# Patient Record
Sex: Female | Born: 1960 | Race: White | Hispanic: No | Marital: Single | Smoking: Never smoker
Health system: Southern US, Community
[De-identification: ages and names within clinical notes are randomized; demographics above are authoritative.]

## PROBLEM LIST (undated history)

## (undated) DIAGNOSIS — F99 Mental disorder, not otherwise specified: Secondary | ICD-10-CM

## (undated) DIAGNOSIS — N189 Chronic kidney disease, unspecified: Secondary | ICD-10-CM

## (undated) DIAGNOSIS — Z9889 Other specified postprocedural states: Secondary | ICD-10-CM

## (undated) DIAGNOSIS — K219 Gastro-esophageal reflux disease without esophagitis: Secondary | ICD-10-CM

## (undated) DIAGNOSIS — F419 Anxiety disorder, unspecified: Secondary | ICD-10-CM

## (undated) DIAGNOSIS — E11319 Type 2 diabetes mellitus with unspecified diabetic retinopathy without macular edema: Secondary | ICD-10-CM

## (undated) DIAGNOSIS — H269 Unspecified cataract: Secondary | ICD-10-CM

## (undated) DIAGNOSIS — K069 Disorder of gingiva and edentulous alveolar ridge, unspecified: Secondary | ICD-10-CM

## (undated) DIAGNOSIS — E119 Type 2 diabetes mellitus without complications: Secondary | ICD-10-CM

## (undated) DIAGNOSIS — N951 Menopausal and female climacteric states: Secondary | ICD-10-CM

## (undated) DIAGNOSIS — R112 Nausea with vomiting, unspecified: Secondary | ICD-10-CM

## (undated) DIAGNOSIS — M1990 Unspecified osteoarthritis, unspecified site: Secondary | ICD-10-CM

## (undated) DIAGNOSIS — N949 Unspecified condition associated with female genital organs and menstrual cycle: Secondary | ICD-10-CM

## (undated) DIAGNOSIS — I10 Essential (primary) hypertension: Secondary | ICD-10-CM

## (undated) HISTORY — DX: Mental disorder, not otherwise specified: F99

## (undated) HISTORY — DX: Type 2 diabetes mellitus without complications: E11.9

## (undated) HISTORY — PX: HAND SURGERY: SHX662

## (undated) HISTORY — DX: Unspecified cataract: H26.9

## (undated) HISTORY — DX: Menopausal and female climacteric states: N95.1

## (undated) HISTORY — PX: OTHER SURGICAL HISTORY: SHX169

## (undated) HISTORY — DX: Chronic kidney disease, unspecified: N18.9

## (undated) HISTORY — DX: Unspecified condition associated with female genital organs and menstrual cycle: N94.9

## (undated) HISTORY — PX: EYE SURGERY: SHX253

## (undated) HISTORY — DX: Disorder of gingiva and edentulous alveolar ridge, unspecified: K06.9

---

## 2000-06-18 HISTORY — PX: SHOULDER SURGERY: SHX246

## 2003-01-26 ENCOUNTER — Ambulatory Visit (HOSPITAL_COMMUNITY): Admission: RE | Admit: 2003-01-26 | Discharge: 2003-01-26 | Payer: Self-pay | Admitting: Obstetrics and Gynecology

## 2003-01-26 ENCOUNTER — Encounter: Payer: Self-pay | Admitting: Obstetrics and Gynecology

## 2004-03-27 ENCOUNTER — Ambulatory Visit (HOSPITAL_COMMUNITY): Admission: RE | Admit: 2004-03-27 | Discharge: 2004-03-27 | Payer: Self-pay | Admitting: Obstetrics and Gynecology

## 2005-05-01 ENCOUNTER — Ambulatory Visit (HOSPITAL_COMMUNITY): Admission: RE | Admit: 2005-05-01 | Discharge: 2005-05-01 | Payer: Self-pay | Admitting: Obstetrics and Gynecology

## 2006-05-07 ENCOUNTER — Ambulatory Visit (HOSPITAL_COMMUNITY): Admission: RE | Admit: 2006-05-07 | Discharge: 2006-05-07 | Payer: Self-pay | Admitting: Obstetrics and Gynecology

## 2007-03-08 ENCOUNTER — Emergency Department (HOSPITAL_COMMUNITY): Admission: EM | Admit: 2007-03-08 | Discharge: 2007-03-08 | Payer: Self-pay | Admitting: Emergency Medicine

## 2007-09-09 ENCOUNTER — Ambulatory Visit (HOSPITAL_COMMUNITY): Admission: RE | Admit: 2007-09-09 | Discharge: 2007-09-09 | Payer: Self-pay | Admitting: Obstetrics and Gynecology

## 2007-12-11 IMAGING — CR DG FOOT COMPLETE 3+V*L*
3 series · 3 of 3 positions shown · non-contrast
Comparison: none

CLINICAL DATA: Pain over the dorsum of the left foot.  No trauma.  
 LEFT FOOT ? 3 VIEW:

[view not recorded (1 of 3)]
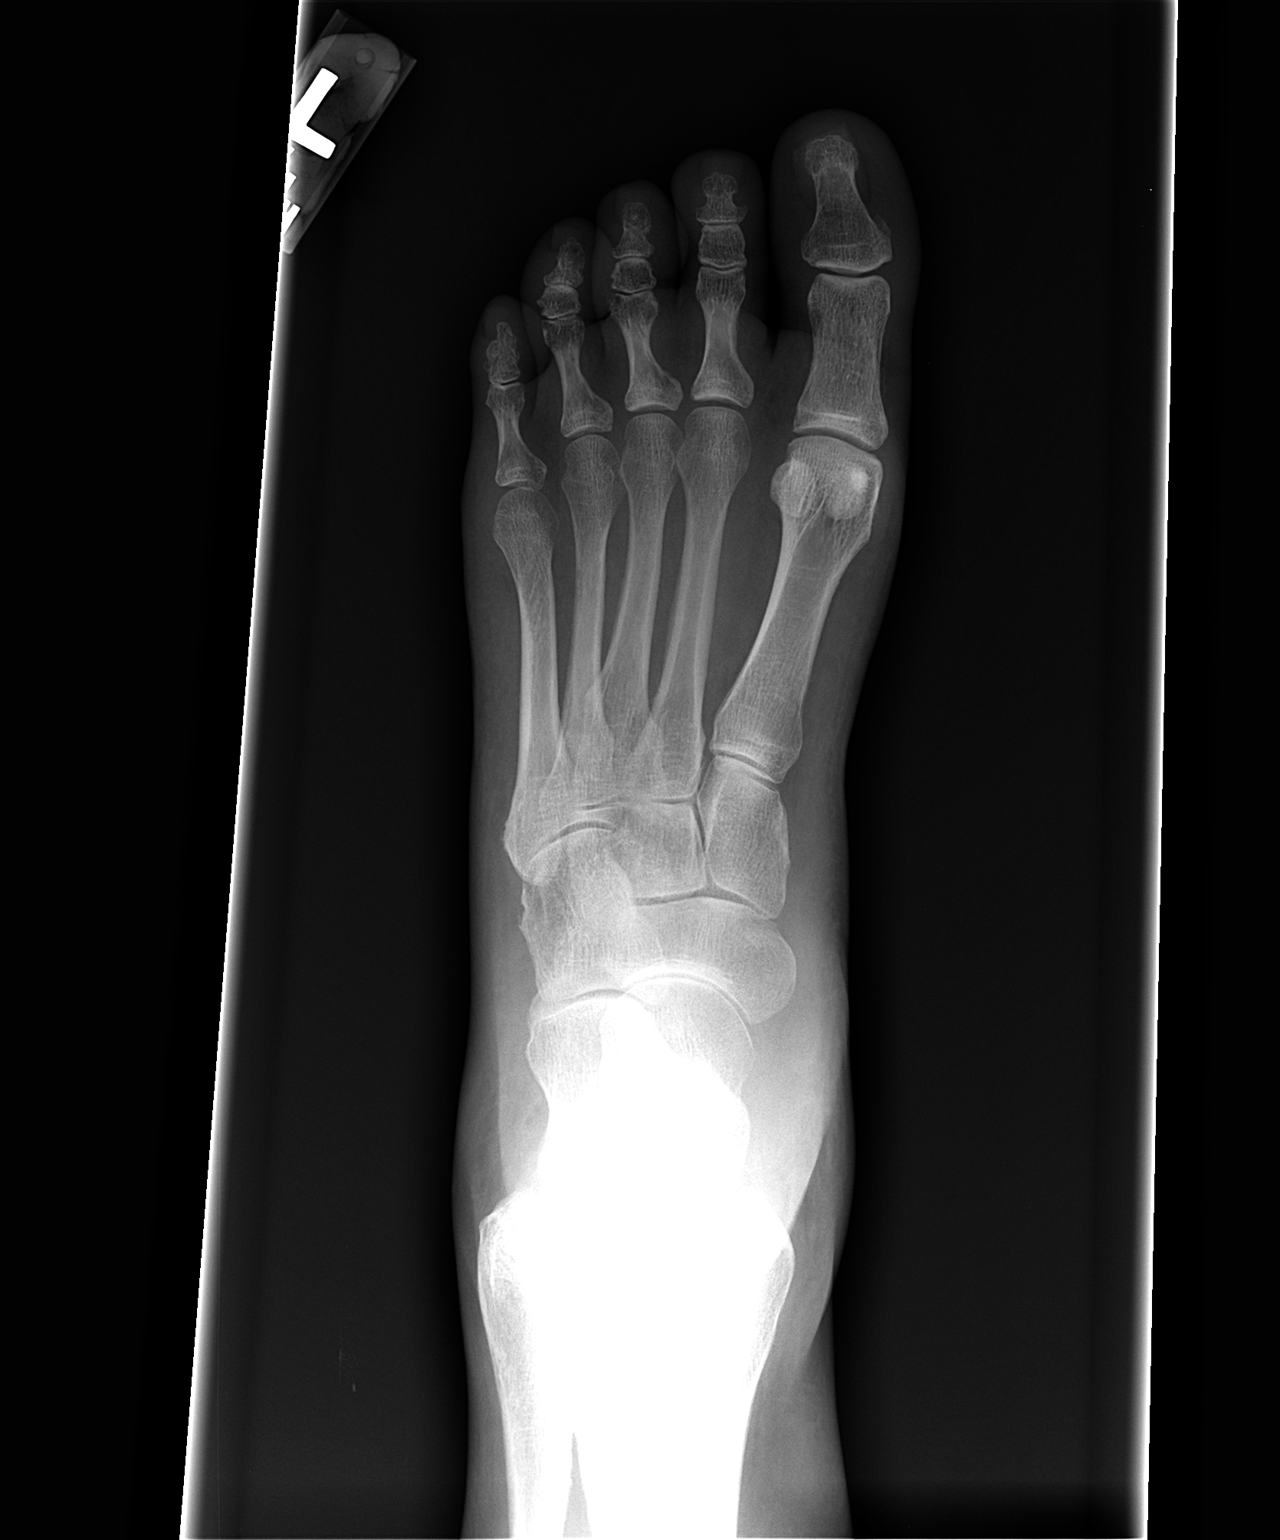

[view not recorded (2 of 3)]
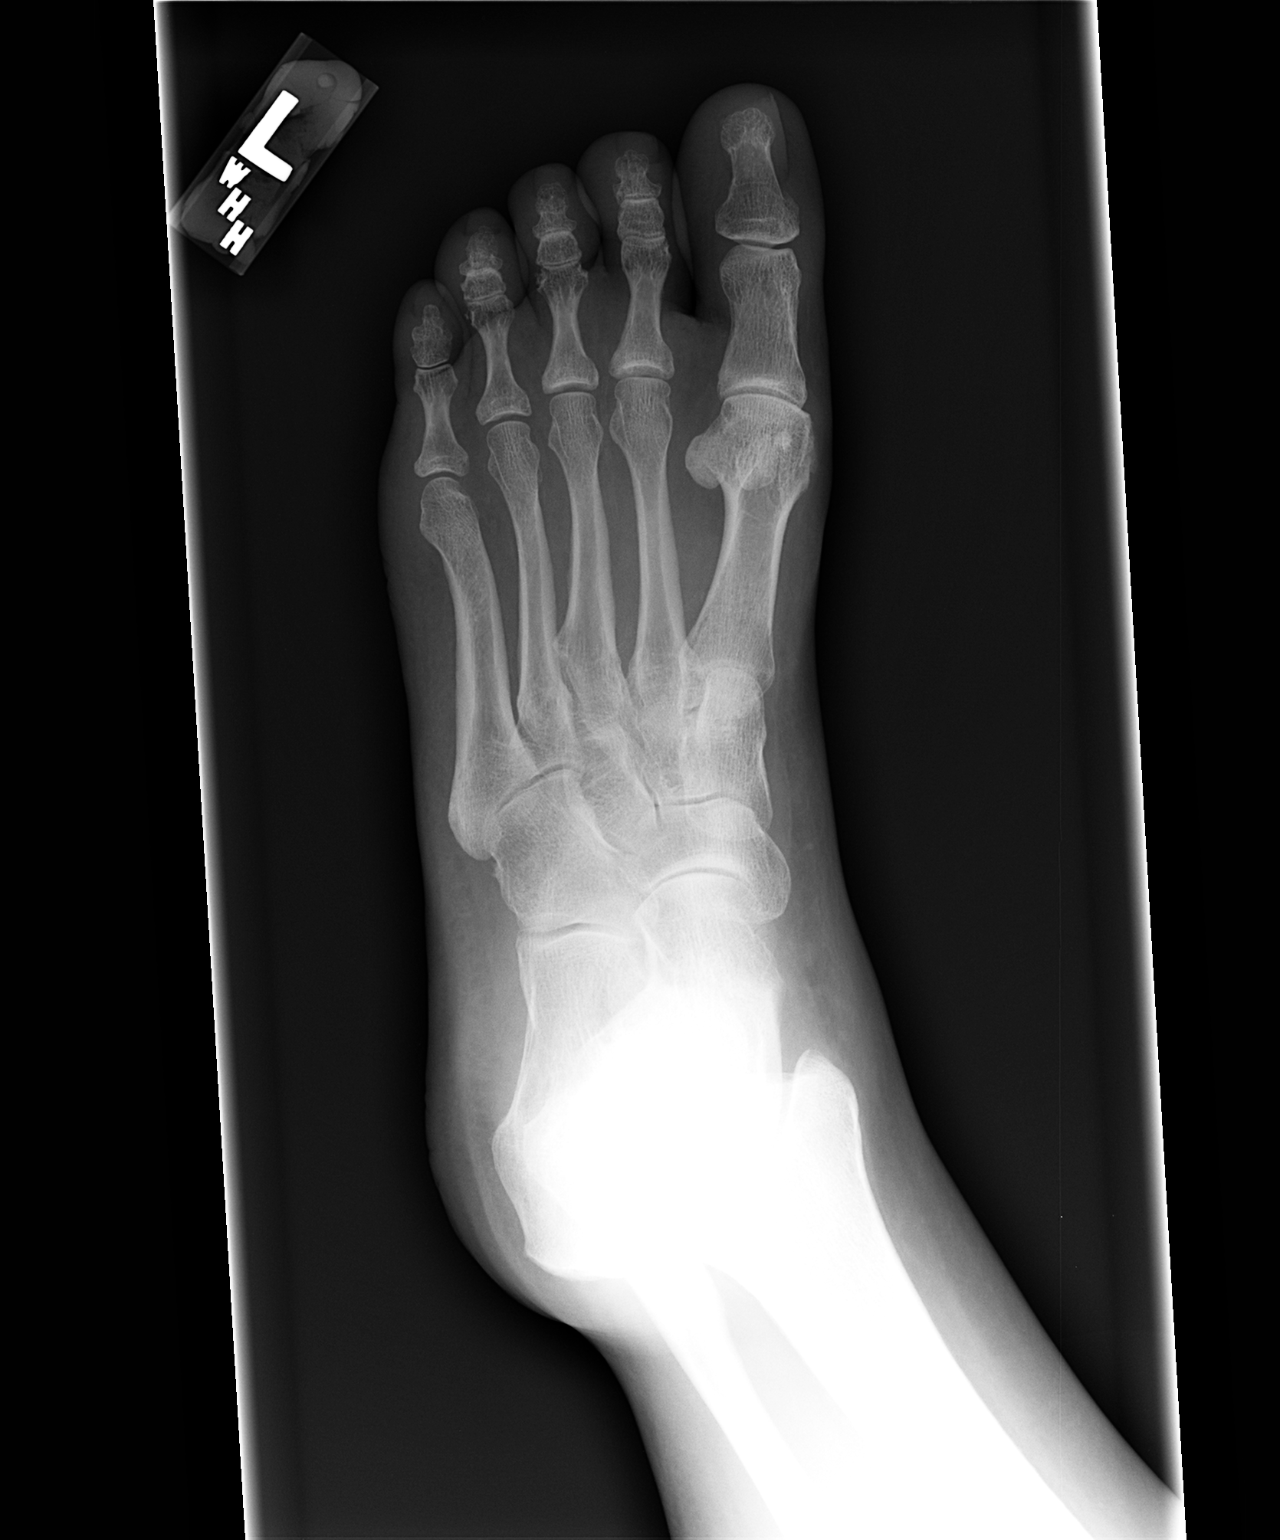

[view not recorded (3 of 3)]
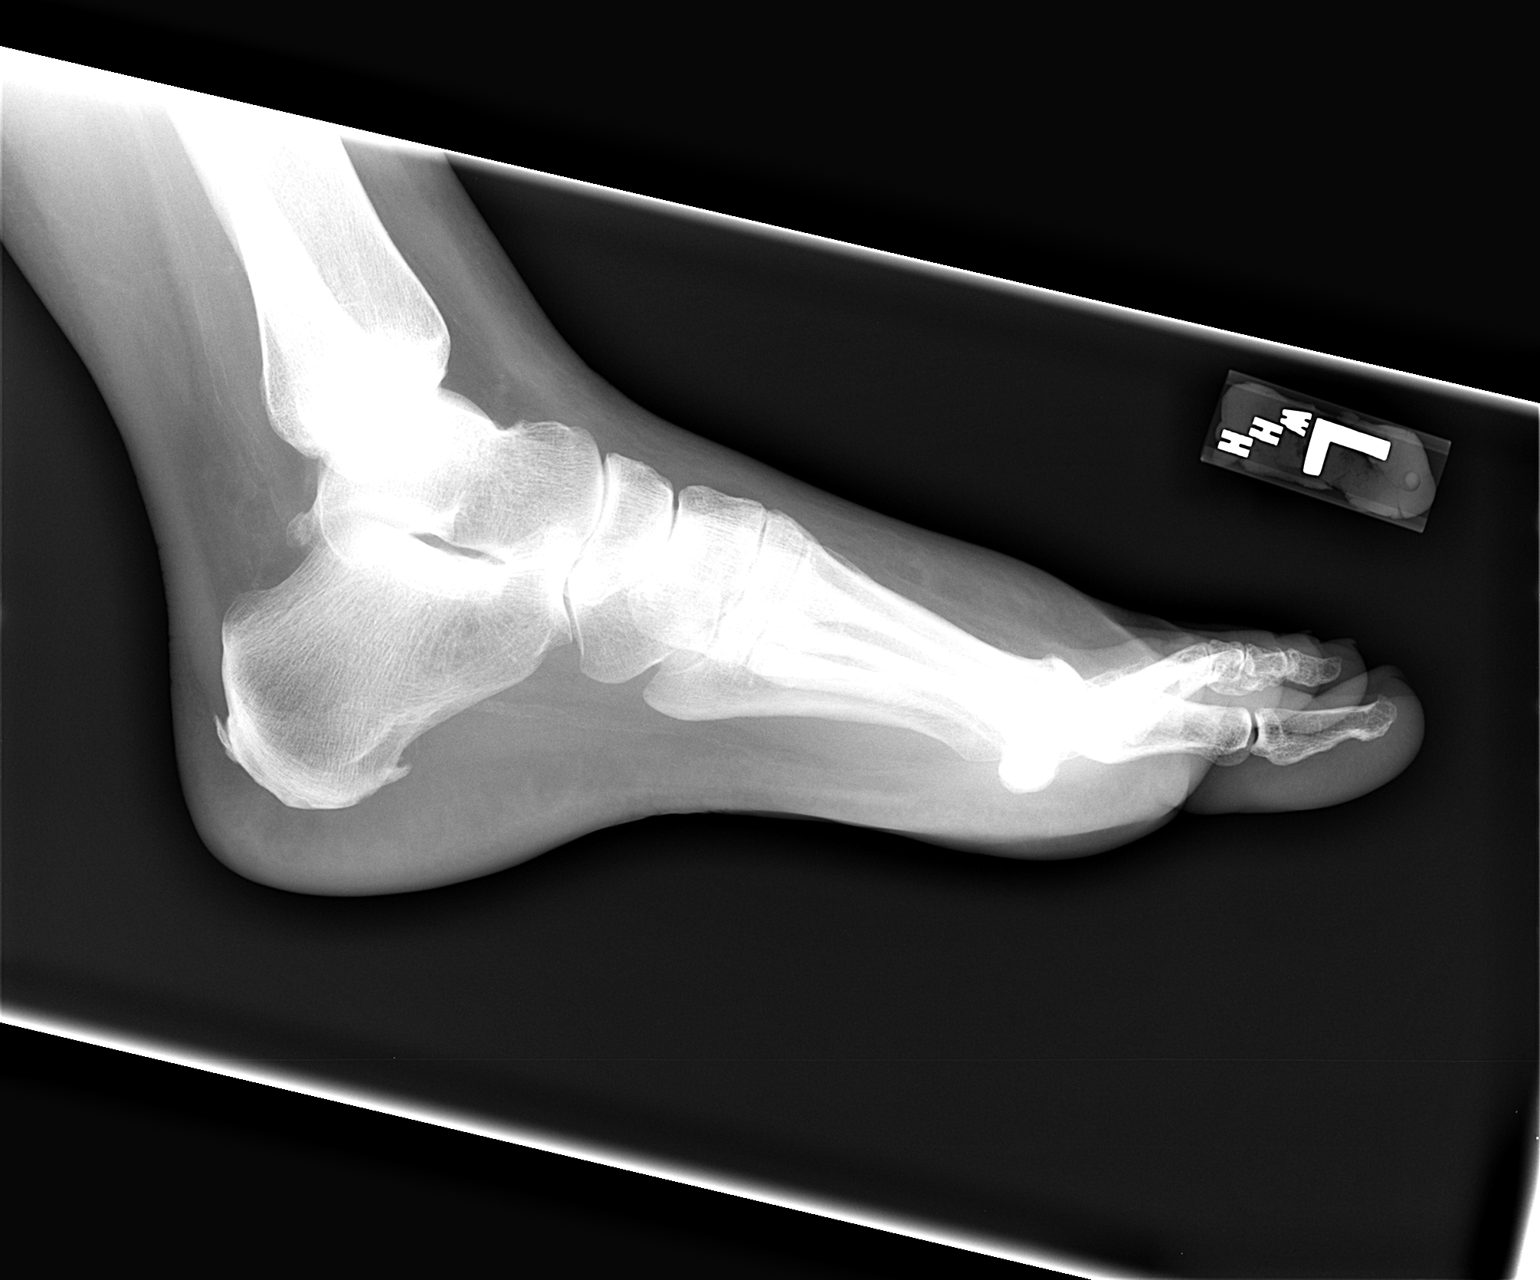

[3 of 3 positions shown; findings below may reference images not displayed]

FINDINGS: No fracture or dislocation.  Plantar calcaneal and Achilles tendon insertion spurring noted.
IMPRESSION: No acute finding.

## 2008-01-07 ENCOUNTER — Other Ambulatory Visit: Admission: RE | Admit: 2008-01-07 | Discharge: 2008-01-07 | Payer: Self-pay | Admitting: Obstetrics and Gynecology

## 2009-03-28 ENCOUNTER — Other Ambulatory Visit: Admission: RE | Admit: 2009-03-28 | Discharge: 2009-03-28 | Payer: Self-pay | Admitting: Obstetrics and Gynecology

## 2009-03-28 ENCOUNTER — Ambulatory Visit (HOSPITAL_COMMUNITY): Admission: RE | Admit: 2009-03-28 | Discharge: 2009-03-28 | Payer: Self-pay | Admitting: Obstetrics and Gynecology

## 2010-03-06 ENCOUNTER — Encounter
Admission: RE | Admit: 2010-03-06 | Discharge: 2010-03-27 | Payer: Self-pay | Source: Home / Self Care | Attending: *Deleted | Admitting: *Deleted

## 2010-05-19 ENCOUNTER — Encounter
Admission: RE | Admit: 2010-05-19 | Discharge: 2010-06-15 | Payer: Self-pay | Source: Home / Self Care | Attending: *Deleted | Admitting: *Deleted

## 2010-06-18 ENCOUNTER — Encounter: Admission: RE | Admit: 2010-06-18 | Payer: Self-pay | Source: Home / Self Care | Admitting: *Deleted

## 2011-03-07 ENCOUNTER — Other Ambulatory Visit: Payer: Self-pay | Admitting: Obstetrics and Gynecology

## 2011-03-07 DIAGNOSIS — Z139 Encounter for screening, unspecified: Secondary | ICD-10-CM

## 2011-04-09 ENCOUNTER — Ambulatory Visit (HOSPITAL_COMMUNITY)
Admission: RE | Admit: 2011-04-09 | Discharge: 2011-04-09 | Disposition: A | Discharge status: Home / Self Care | Payer: Medicare Other | Source: Ambulatory Visit | Attending: Obstetrics and Gynecology | Admitting: Obstetrics and Gynecology

## 2011-04-09 DIAGNOSIS — Z139 Encounter for screening, unspecified: Secondary | ICD-10-CM

## 2011-04-09 DIAGNOSIS — Z1231 Encounter for screening mammogram for malignant neoplasm of breast: Secondary | ICD-10-CM | POA: Insufficient documentation

## 2011-05-07 DIAGNOSIS — H2510 Age-related nuclear cataract, unspecified eye: Secondary | ICD-10-CM | POA: Insufficient documentation

## 2011-07-17 ENCOUNTER — Other Ambulatory Visit: Payer: Self-pay | Admitting: Adult Health

## 2011-07-17 ENCOUNTER — Other Ambulatory Visit (HOSPITAL_COMMUNITY)
Admission: RE | Admit: 2011-07-17 | Discharge: 2011-07-17 | Disposition: A | Discharge status: Home / Self Care | Payer: Medicare Other | Source: Ambulatory Visit | Attending: Obstetrics and Gynecology | Admitting: Obstetrics and Gynecology

## 2011-07-17 DIAGNOSIS — Z124 Encounter for screening for malignant neoplasm of cervix: Secondary | ICD-10-CM | POA: Insufficient documentation

## 2011-12-20 ENCOUNTER — Encounter (HOSPITAL_COMMUNITY): Payer: Self-pay | Admitting: Emergency Medicine

## 2011-12-20 ENCOUNTER — Emergency Department (HOSPITAL_COMMUNITY): Payer: Medicare Other

## 2011-12-20 ENCOUNTER — Emergency Department (HOSPITAL_COMMUNITY)
Admission: EM | Admit: 2011-12-20 | Discharge: 2011-12-20 | Disposition: A | Discharge status: Home / Self Care | Payer: Medicare Other | Attending: Emergency Medicine | Admitting: Emergency Medicine

## 2011-12-20 DIAGNOSIS — R0602 Shortness of breath: Secondary | ICD-10-CM

## 2011-12-20 DIAGNOSIS — G4700 Insomnia, unspecified: Secondary | ICD-10-CM | POA: Insufficient documentation

## 2011-12-20 DIAGNOSIS — F411 Generalized anxiety disorder: Secondary | ICD-10-CM | POA: Insufficient documentation

## 2011-12-20 DIAGNOSIS — Z9641 Presence of insulin pump (external) (internal): Secondary | ICD-10-CM | POA: Insufficient documentation

## 2011-12-20 DIAGNOSIS — F419 Anxiety disorder, unspecified: Secondary | ICD-10-CM

## 2011-12-20 DIAGNOSIS — M129 Arthropathy, unspecified: Secondary | ICD-10-CM | POA: Insufficient documentation

## 2011-12-20 DIAGNOSIS — E119 Type 2 diabetes mellitus without complications: Secondary | ICD-10-CM | POA: Insufficient documentation

## 2011-12-20 HISTORY — DX: Unspecified osteoarthritis, unspecified site: M19.90

## 2011-12-20 HISTORY — DX: Type 2 diabetes mellitus with unspecified diabetic retinopathy without macular edema: E11.319

## 2011-12-20 HISTORY — DX: Anxiety disorder, unspecified: F41.9

## 2011-12-20 MED ORDER — CLONAZEPAM 0.5 MG PO TABS
0.5000 mg | ORAL_TABLET | Freq: Once | ORAL | Status: AC
  Administered 2011-12-20: 0.5 mg via ORAL
  Filled 2011-12-20 (×2): qty 1

## 2011-12-20 NOTE — ED Notes (Signed)
Patient c/o shortness of breath; patient also has history of anxiety.

## 2011-12-20 NOTE — ED Notes (Signed)
Patient ambulatory to bathroom with steady gait.

## 2011-12-20 NOTE — ED Notes (Signed)
Opened chart because pt called and wanted to know what we gave her while she was here so she could tell her Pcp.

## 2011-12-20 NOTE — ED Notes (Signed)
Pt alert & oriented x4, stable gait. Pt given discharge instructions, paperwork & prescription(s). Patient instructed to stop at the registration desk to finish any additional paperwork. pt verbalized understanding. Pt left department w/ no further questions.  

## 2011-12-20 NOTE — ED Provider Notes (Signed)
History     CSN: [id number]  Arrival date & time 12/20/11  0419   First MD Initiated Contact with Patient 12/20/11 [phone]      Chief Complaint  Patient presents with  . Shortness of Breath    (Consider location/radiation/quality/duration/timing/severity/associated sxs/prior treatment) HPI Carol Jensen is a 51 y.o. female with a h/o diabetes on an insulin pump, anxiety, brought in by ambulance, who presents to the Emergency Department complaining of shortness of breath associated with increased anxiety that has been worsening over the past two weeks. She relates she has not been sleeping well for several days, has increased her prescribed oxazepam without relief, and has been experiencing  tightness in her shoulders, neck and chest that increases with her anxiety. She has been seen by her OB/GYN who advised she should consider Lexapro to help both with the anxiety and perimenopausal symptoms.  Her shortness of breath symptoms require that she take deep sighs to feel like she is getting enough air.  PCP Dr. Renard Matter  Past Medical History  Diagnosis Date  . Anxiety   . Arthritis   . Diabetes mellitus   . Diabetic retinopathy     Past Surgical History  Procedure Date  . Eye surgery   . Hand surgery   . Shoulder surgery     No family history on file.  History  Substance Use Topics  . Smoking status: Never Smoker   . Smokeless tobacco: Not on file  . Alcohol Use: No    OB History    Grav Para Term Preterm Abortions TAB SAB Ect Mult Living                  Review of Systems  Constitutional: Negative for fever.       10 Systems reviewed and are negative for acute change except as noted in the HPI.  HENT: Negative for congestion.   Eyes: Negative for discharge and redness.  Respiratory: Positive for shortness of breath. Negative for cough.   Cardiovascular: Negative for chest pain.  Gastrointestinal: Negative for vomiting and abdominal pain.  Musculoskeletal: Negative for  back pain.  Skin: Negative for rash.  Neurological: Negative for syncope, numbness and headaches.  Psychiatric/Behavioral:       Anxiety    Allergies  Review of patient's allergies indicates no known allergies.  Home Medications   Current Outpatient Rx  Name Route Sig Dispense Refill  . ACETAMINOPHEN 325 MG PO TABS Oral Take 650 mg by mouth every 6 (six) hours as needed.    Marland Kitchen HYDROCHLOROTHIAZIDE 25 MG PO TABS Oral Take 25 mg by mouth daily.    . INSULIN PUMP Subcutaneous Inject into the skin.    Marland Kitchen LISINOPRIL 10 MG PO TABS Oral Take 10 mg by mouth daily.    Marland Kitchen OMEPRAZOLE 20 MG PO CPDR Oral Take 20 mg by mouth daily.    Marland Kitchen OXAZEPAM 15 MG PO CAPS Oral Take 15 mg by mouth 2 (two) times daily.    Marland Kitchen VITAMIN C 500 MG PO TABS Oral Take 500 mg by mouth daily.    Marland Kitchen VITAMIN E 400 UNITS PO CAPS Oral Take 400 Units by mouth daily.      BP 144/62  Pulse 106  Temp 98.7 F (37.1 C) (Oral)  Resp 20  Ht 5\' 4"  (1.626 m)  Wt 214 lb (97.07 kg)  BMI 36.73 kg/m2  SpO2 99%  LMP 12/20/2011  Physical Exam  Nursing note and vitals reviewed. Constitutional: She appears  well-developed and well-nourished.       anxious  HENT:  Head: Atraumatic.  Eyes: Right eye exhibits no discharge. Left eye exhibits no discharge.  Neck: Neck supple.  Cardiovascular: Normal rate and normal heart sounds.   Pulmonary/Chest: Effort normal. She exhibits no tenderness.  Abdominal: Soft. There is no tenderness. There is no rebound.  Musculoskeletal: She exhibits no tenderness.       Baseline ROM, no obvious new focal weakness.  Neurological:       Mental status and motor strength appears baseline for patient and situation.  Skin: No rash noted.  Psychiatric:       anxious    ED Course  Procedures (including critical care time)  Labs Reviewed - No data to display Dg Chest 2 View  12/20/2011  *RADIOLOGY REPORT*  Clinical Data: Shortness of breath  CHEST - 2 VIEW  Comparison: None.  Findings: Lungs are clear. No  pleural effusion or pneumothorax. The cardiomediastinal contours are within normal limits. The visualized bones and soft tissues are without significant appreciable abnormality.  IMPRESSION: No radiographic evidence of acute cardiopulmonary process.  Original Report Authenticated By: Waneta Martins, M.D.        MDM  Patient with h/o anxiety who has had an increase in anxiety, insomnia and sensation of shortness of breath.Chest xray is negative. Patient given klonopin. She is to follow up with her PCP, Dr. Renard Matter on Friday. Pt stable in ED with no significant deterioration in condition.The patient appears reasonably screened and/or stabilized for discharge and I doubt any other medical condition or other [facility] requiring further screening, evaluation, or treatment in the ED at this time prior to discharge.  MDM Reviewed: nursing note and vitals Interpretation: x-ray           Nicoletta Dress. Colon Branch, MD 12/20/11 [phone]

## 2011-12-20 NOTE — ED Notes (Signed)
Patient to xray via stretcher

## 2011-12-24 NOTE — ED Notes (Signed)
Chart was opened due to pt wanting a copy of her discharge papers.

## 2012-03-27 DIAGNOSIS — K219 Gastro-esophageal reflux disease without esophagitis: Secondary | ICD-10-CM | POA: Insufficient documentation

## 2012-04-08 ENCOUNTER — Ambulatory Visit (HOSPITAL_COMMUNITY): Payer: Medicare Other

## 2013-01-30 ENCOUNTER — Other Ambulatory Visit: Payer: Self-pay | Admitting: Adult Health

## 2013-01-30 DIAGNOSIS — Z139 Encounter for screening, unspecified: Secondary | ICD-10-CM

## 2013-04-09 ENCOUNTER — Ambulatory Visit (HOSPITAL_COMMUNITY)
Admission: RE | Admit: 2013-04-09 | Discharge: 2013-04-09 | Disposition: A | Discharge status: Home / Self Care | Payer: Medicare Other | Source: Ambulatory Visit | Attending: Adult Health | Admitting: Adult Health

## 2013-04-09 DIAGNOSIS — Z1231 Encounter for screening mammogram for malignant neoplasm of breast: Secondary | ICD-10-CM | POA: Insufficient documentation

## 2013-04-09 DIAGNOSIS — Z139 Encounter for screening, unspecified: Secondary | ICD-10-CM

## 2013-04-22 ENCOUNTER — Telehealth: Payer: Self-pay | Admitting: Adult Health

## 2013-04-22 NOTE — Telephone Encounter (Signed)
Pt had normal mammogram but had dense breast reassured  Do BSE get exam here yearly and get mammograms will probably be 3 D next year, discussed MRI if concerned

## 2013-05-10 DIAGNOSIS — E113599 Type 2 diabetes mellitus with proliferative diabetic retinopathy without macular edema, unspecified eye: Secondary | ICD-10-CM | POA: Insufficient documentation

## 2013-05-10 DIAGNOSIS — E103599 Type 1 diabetes mellitus with proliferative diabetic retinopathy without macular edema, unspecified eye: Secondary | ICD-10-CM | POA: Insufficient documentation

## 2013-09-08 ENCOUNTER — Encounter: Payer: Self-pay | Admitting: Adult Health

## 2013-09-08 ENCOUNTER — Other Ambulatory Visit (HOSPITAL_COMMUNITY)
Admission: RE | Admit: 2013-09-08 | Discharge: 2013-09-08 | Disposition: A | Discharge status: Home / Self Care | Payer: Medicare Other | Source: Ambulatory Visit | Attending: Adult Health | Admitting: Adult Health

## 2013-09-08 ENCOUNTER — Encounter (INDEPENDENT_AMBULATORY_CARE_PROVIDER_SITE_OTHER): Payer: Self-pay

## 2013-09-08 ENCOUNTER — Ambulatory Visit (INDEPENDENT_AMBULATORY_CARE_PROVIDER_SITE_OTHER): Payer: Medicare Other | Admitting: Adult Health

## 2013-09-08 VITALS — BP 108/60 | HR 76 | Ht 64.0 in | Wt 227.0 lb

## 2013-09-08 DIAGNOSIS — N949 Unspecified condition associated with female genital organs and menstrual cycle: Secondary | ICD-10-CM

## 2013-09-08 DIAGNOSIS — R232 Flushing: Secondary | ICD-10-CM | POA: Insufficient documentation

## 2013-09-08 DIAGNOSIS — Z124 Encounter for screening for malignant neoplasm of cervix: Secondary | ICD-10-CM | POA: Insufficient documentation

## 2013-09-08 DIAGNOSIS — N951 Menopausal and female climacteric states: Secondary | ICD-10-CM

## 2013-09-08 DIAGNOSIS — Z01419 Encounter for gynecological examination (general) (routine) without abnormal findings: Secondary | ICD-10-CM

## 2013-09-08 DIAGNOSIS — Z1151 Encounter for screening for human papillomavirus (HPV): Secondary | ICD-10-CM | POA: Insufficient documentation

## 2013-09-08 DIAGNOSIS — E119 Type 2 diabetes mellitus without complications: Secondary | ICD-10-CM

## 2013-09-08 HISTORY — DX: Menopausal and female climacteric states: N95.1

## 2013-09-08 HISTORY — DX: Unspecified condition associated with female genital organs and menstrual cycle: N94.9

## 2013-09-08 HISTORY — DX: Type 2 diabetes mellitus without complications: E11.9

## 2013-09-08 MED ORDER — NYSTATIN-TRIAMCINOLONE 100000-0.1 UNIT/GM-% EX OINT: 1.0000 "application " | TOPICAL_OINTMENT | Freq: Two times a day (BID) | CUTANEOUS | Status: DC

## 2013-09-08 NOTE — Progress Notes (Signed)
[name], female   DOB: 06-04-1961, 53 y.o.   MRN: [id number] History of Present Illness: Carol Jensen is a 53 year old white female, single in for a pap and physical.She is diabetic on an insulin pump.She is complaining of hot flashes,night sweats,moody at times and nausea at times.LMP 03/24/13.Has had mammogram and dexa, A1c in 7's.Had normal EKG 7/14.   Current Medications, Allergies, Past Medical History, Past Surgical History, Family History and Social History were reviewed in [facility] electronic [facility] record.     Review of Systems: Patient denies any headaches, shortness of breath, chest pain, abdominal pain, problems with bowel movements, urination, or intercourse. She is not having sex,has body aches and see positives in HPI. She has cataracts that are inoperable.takes phillips gummies to help with any constipation.   Physical Exam:BP 108/60  Pulse 76  Ht 5\' 4"  (1.626 m)  Wt 227 lb (102.967 kg)  BMI 38.95 kg/m2  LMP 03/24/2013 General:  Well developed, well nourished, no acute distress Skin:  Warm and dry Neck:  Midline trachea, normal thyroid Lungs; Clear to auscultation bilaterally Breast:  No dominant palpable mass, retraction, or nipple discharge,firm Cardiovascular: Regular rate and rhythm Abdomen:  Soft, non tender, no hepatosplenomegaly Pelvic:  External genitalia is normal in appearance.  The vagina is normal in appearance. The cervix is smooth,pap with HPV performed.  Uterus is felt to be normal size, shape, and contour.  No                adnexal masses, has  tenderness on palpation. Rectal: Good sphincter tone, no polyps, or hemorrhoids felt.  Hemoccult negative. Extremities:  No swelling or varicosities noted Psych:  Alert and cooperative,seems happy Discussed menopause and HRT and brisdelle, declines for now.  Impression: Yearly gyn exam Hot flashes  Diabetes Perimenopausal Pelvic pain on exam   Plan: Physical in 2 years Return in 2-3  weeks for US and see me Mammogram yearly Colonoscopy advised Labs with PCP Review handouts on perimenopause and menopause Refilled mytrex cream x 3

## 2013-09-08 NOTE — Patient Instructions (Addendum)
Menopause Menopause is the normal time of life when menstrual periods stop completely. Menopause is complete when you have missed 12 consecutive menstrual periods. It usually occurs between the ages of 48 years and 55 years. Very rarely does a woman develop menopause before the age of 40 years. At menopause, your ovaries stop producing the female hormones estrogen and progesterone. This can cause undesirable symptoms and also affect your health. Sometimes the symptoms may occur 4 5 years before the menopause begins. There is no relationship between menopause and:  Oral contraceptives.  Number of children you had.  Race.  The age your menstrual periods started (menarche). Heavy smokers and very thin women may develop menopause earlier in life. CAUSES  The ovaries stop producing the female hormones estrogen and progesterone.  Other causes include:  Surgery to remove both ovaries.  The ovaries stop functioning for no known reason.  Tumors of the pituitary gland in the brain.  Medical disease that affects the ovaries and hormone production.  Radiation treatment to the abdomen or pelvis.  Chemotherapy that affects the ovaries. SYMPTOMS   Hot flashes.  Night sweats.  Decrease in sex drive.  Vaginal dryness and thinning of the vagina causing painful intercourse.  Dryness of the skin and developing wrinkles.  Headaches.  Tiredness.  Irritability.  Memory problems.  Weight gain.  Bladder infections.  Hair growth of the face and chest.  Infertility. More serious symptoms include:  Loss of bone (osteoporosis) causing breaks (fractures).  Depression.  Hardening and narrowing of the arteries (atherosclerosis) causing heart attacks and strokes. DIAGNOSIS   When the menstrual periods have stopped for 12 straight months.  Physical exam.  Hormone studies of the blood. TREATMENT  There are many treatment choices and nearly as many questions about them. The  decisions to treat or not to treat menopausal changes is an individual choice made with your health care provider. Your health care provider can discuss the treatments with you. Together, you can decide which treatment will work best for you. Your treatment choices may include:   Hormone therapy (estrogen and progesterone).  Non-hormonal medicines.  Treating the individual symptoms with medicine (for example antidepressants for depression).  Herbal medicines that may help specific symptoms.  Counseling by a psychiatrist or psychologist.  Group therapy.  Lifestyle changes including:  Eating healthy.  Regular exercise.  Limiting caffeine and alcohol.  Stress management and meditation.  No treatment. HOME CARE INSTRUCTIONS   Take the medicine your health care provider gives you as directed.  Get plenty of sleep and rest.  Exercise regularly.  Eat a diet that contains calcium (good for the bones) and soy products (acts like estrogen hormone).  Avoid alcoholic beverages.  Do not smoke.  If you have hot flashes, dress in layers.  Take supplements, calcium, and vitamin D to strengthen bones.  You can use over-the-counter lubricants or moisturizers for vaginal dryness.  Group therapy is sometimes very helpful.  Acupuncture may be helpful in some cases. SEEK MEDICAL CARE IF:   You are not sure you are in menopause.  You are having menopausal symptoms and need advice and treatment.  You are still having menstrual periods after age 55 years.  You have pain with intercourse.  Menopause is complete (no menstrual period for 12 months) and you develop vaginal bleeding.  You need a referral to a specialist (gynecologist, psychiatrist, or psychologist) for treatment. SEEK IMMEDIATE MEDICAL CARE IF:   You have severe depression.  You have excessive vaginal bleeding.    You fell and think you have a broken bone.  You have pain when you urinate.  You develop leg or  chest pain.  You have a fast pounding heart beat (palpitations).  You have severe headaches.  You develop vision problems.  You feel a lump in your breast.  You have abdominal pain or severe indigestion. Document Released: 08/25/2003 Document Revised: 02/04/2013 Document Reviewed: 01/01/2013 [facility] Patient Information 2014 [place], [place]. Perimenopause Perimenopause is the time when your body begins to move into the menopause (no menstrual period for 12 straight months). It is a natural process. Perimenopause can begin 2 8 years before the menopause and usually lasts for 1 year after the menopause. During this time, your ovaries may or may not produce an egg. The ovaries vary in their production of estrogen and progesterone hormones each month. This can cause irregular menstrual periods, difficulty getting pregnant, vaginal bleeding between periods, and uncomfortable symptoms. CAUSES  Irregular production of the ovarian hormones, estrogen and progesterone, and not ovulating every month.  Other causes include:  Tumor of the pituitary gland in the brain.  Medical disease that affects the ovaries.  Radiation treatment.  Chemotherapy.  Unknown causes.  Heavy smoking and excessive alcohol intake can bring on perimenopause sooner. SIGNS AND SYMPTOMS   Hot flashes.  Night sweats.  Irregular menstrual periods.  Decreased sex drive.  Vaginal dryness.  Headaches.  Mood swings.  Depression.  Memory problems.  Irritability.  Tiredness.  Weight gain.  Trouble getting pregnant.  The beginning of losing bone cells (osteoporosis).  The beginning of hardening of the arteries (atherosclerosis). DIAGNOSIS  Your health care provider will make a diagnosis by analyzing your age, menstrual history, and symptoms. He or she will do a physical exam and note any changes in your body, especially your female organs. Female hormone tests may or may not be helpful depending on the  amount of female hormones you produce and when you produce them. However, other hormone tests may be helpful to rule out other problems. TREATMENT  In some cases, no treatment is needed. The decision on whether treatment is necessary during the perimenopause should be made by you and your health care provider based on how the symptoms are affecting you and your lifestyle. Various treatments are available, such as:  Treating individual symptoms with a specific medicine for that symptom.  Herbal medicines that can help specific symptoms.  Counseling.  Group therapy. HOME CARE INSTRUCTIONS   Keep track of your menstrual periods (when they occur, how heavy they are, how long between periods, and how long they last) as well as your symptoms and when they started.  Only take over-the-counter or prescription medicines as directed by your health care provider.  Sleep and rest.  Exercise.  Eat a diet that contains calcium (good for your bones) and soy (acts like the estrogen hormone).  Do not smoke.  Avoid alcoholic beverages.  Take vitamin supplements as recommended by your health care provider. Taking vitamin E may help in certain cases.  Take calcium and vitamin D supplements to help prevent bone loss.  Group therapy is sometimes helpful.  Acupuncture may help in some cases. SEEK MEDICAL CARE IF:   You have questions about any symptoms you are having.  You need a referral to a specialist (gynecologist, psychiatrist, or psychologist). SEEK IMMEDIATE MEDICAL CARE IF:   You have vaginal bleeding.  Your period lasts longer than 8 days.  Your periods are recurring sooner than 21 days.  You  have bleeding after intercourse.  You have severe depression.  You have pain when you urinate.  You have severe headaches.  You have vision problems. Document Released: 07/12/2004 Document Revised: 03/25/2013 Document Reviewed: 01/01/2013 [facility] Patient Information 2014 [place],  [place]. Physical in 2 years Mammogram yearly Colonoscopy advised  Return in 2-3 weeks for [place]  And see me

## 2013-09-23 ENCOUNTER — Ambulatory Visit (INDEPENDENT_AMBULATORY_CARE_PROVIDER_SITE_OTHER): Payer: Medicare Other

## 2013-09-23 ENCOUNTER — Ambulatory Visit (INDEPENDENT_AMBULATORY_CARE_PROVIDER_SITE_OTHER): Payer: Medicare Other | Admitting: Adult Health

## 2013-09-23 ENCOUNTER — Encounter: Payer: Self-pay | Admitting: Adult Health

## 2013-09-23 VITALS — BP 116/68 | Ht 64.0 in | Wt 230.0 lb

## 2013-09-23 DIAGNOSIS — N840 Polyp of corpus uteri: Secondary | ICD-10-CM | POA: Insufficient documentation

## 2013-09-23 DIAGNOSIS — N949 Unspecified condition associated with female genital organs and menstrual cycle: Secondary | ICD-10-CM

## 2013-09-23 NOTE — Patient Instructions (Signed)
Endometrial Biopsy Endometrial biopsy is a procedure in which a tissue sample is taken from inside the uterus. The tissue sample is then looked at under a microscope to see if the tissue is normal or abnormal. The endometrium is the lining of the uterus. This procedure helps determine where you are in your menstrual cycle and how hormone levels are affecting the lining of the uterus. This procedure may also be used to evaluate uterine bleeding or to diagnose endometrial cancer, tuberculosis, polyps, or inflammatory conditions.  LET [facility] HospitalYOUR HEALTH CARE PROVIDER KNOW ABOUT:  Any allergies you have.  All medicines you are taking, including vitamins, herbs, eye drops, creams, and over-the-counter medicines.  Previous problems you or members of your family have had with the use of anesthetics.  Any blood disorders you have.  Previous surgeries you have had.  Medical conditions you have.  Possibility of pregnancy. RISKS AND COMPLICATIONS Generally, this is a safe procedure. However, as with any procedure, complications can occur. Possible complications include:  Bleeding.  Pelvic infection.  Puncture of the uterine wall with the biopsy device (rare). BEFORE THE PROCEDURE   Keep a record of your menstrual cycles as directed by your health care provider. You may need to schedule your procedure for a specific time in your cycle.  You may want to bring a sanitary pad to wear home after the procedure.  Arrange for someone to drive you home after the procedure if you will be given a medicine to help you relax (sedative). PROCEDURE   You may be given a sedative to relax you.  You will lie on an exam table with your feet and legs supported as in a pelvic exam.  Your health care provider will insert an instrument (speculum) into your vagina to see your cervix.  Your cervix will be cleansed with an antiseptic solution. A medicine (local anesthetic) will be used to numb the cervix.  A forceps  instrument (tenaculum) will be used to hold your cervix steady for the biopsy.  A thin, rodlike instrument (uterine sound) will be inserted through your cervix to determine the length of your uterus and the location where the biopsy sample will be removed.  A thin, flexible tube (catheter) will be inserted through your cervix and into the uterus. The catheter is used to collect the biopsy sample from your endometrial tissue.  The catheter and speculum will then be removed, and the tissue sample will be sent to a lab for examination. AFTER THE PROCEDURE  You will rest in a recovery area until you are ready to go home.  You may have mild cramping and a small amount of vaginal bleeding for a few days after the procedure. This is normal.  Make sure you find out how to get your test results. Document Released: 10/05/2004 Document Revised: 02/04/2013 Document Reviewed: 11/19/2012 [facility] Patient Information 2014 [place], [place]. Return in 1 week to see JVF

## 2013-09-23 NOTE — Progress Notes (Signed)
Subjective:     [name], female   DOB: 1960/11/18, 53 y.o.   MRN: [id number]  HPI Carol Jensen is a 53 year old white female in for US.  Review of Systems See HPI Reviewed past medical,surgical, social and family history. Reviewed medications and allergies.     Objective:   Physical Exam BP 116/68  Ht 5\' 4"  (1.626 m)  Wt 230 lb (104.327 kg)  BMI 39.46 kg/m2  LMP 10/07/2014reviewed US with pt and discussed with Dr Emelda FearFerguson.   Uterus 7.9 x 6.0 x 4.1 cm, retroverted uterus noted  Endometrium 7.8 mm, with 2 hyperechoic areas noted within with +doppler flow noted to mass, #1-769mm #2-698mm  Right ovary 2.5 x 1.9 x 1.6 cm,  Left ovary 3.1 x 1.7 x 1.6 cm,  No free fluid noted within pelvis  Technician Comments:  Transvaginal u/s performed, retroverted uterus noted with Endometrium-7.238mm with 2 hyperechoic areas noted within, bilateral adnexa/ovaries appears wnl, no free fluid noted     Assessment:     Endometrial polyp    Plan:     Return in 1 week for endo biopsy with JVF   Review handout on endo biopsy and D&C

## 2013-10-02 ENCOUNTER — Encounter: Payer: Self-pay | Admitting: Obstetrics and Gynecology

## 2013-10-02 ENCOUNTER — Ambulatory Visit (INDEPENDENT_AMBULATORY_CARE_PROVIDER_SITE_OTHER): Payer: Medicare Other | Admitting: Obstetrics and Gynecology

## 2013-10-02 VITALS — BP 120/80 | Ht 64.0 in | Wt 224.0 lb

## 2013-10-02 DIAGNOSIS — Z01818 Encounter for other preprocedural examination: Secondary | ICD-10-CM

## 2013-10-02 DIAGNOSIS — E109 Type 1 diabetes mellitus without complications: Secondary | ICD-10-CM

## 2013-10-02 DIAGNOSIS — Z3202 Encounter for pregnancy test, result negative: Secondary | ICD-10-CM

## 2013-10-02 DIAGNOSIS — N840 Polyp of corpus uteri: Secondary | ICD-10-CM

## 2013-10-02 DIAGNOSIS — Z3200 Encounter for pregnancy test, result unknown: Secondary | ICD-10-CM

## 2013-10-02 LAB — POCT URINE PREGNANCY: Preg Test, Ur: NEGATIVE

## 2013-10-02 NOTE — Patient Instructions (Addendum)
ENDOMETRIAL BIOPSY POST-PROCEDURE INSTRUCTIONS  1. You may take Ibuprofen, Aleve or Tylenol for pain if needed.  Cramping should resolve within in 24 hours.  2. You may have a small amount of spotting.  You should wear a mini pad for the next few days.  3. You may have intercourse after 24 hours.  4. You need to call if you have any pelvic pain, fever, heavy bleeding or foul smelling vaginal discharge.  5. Shower or bathe as normal   Hysteroscopy Hysteroscopy is a procedure used for looking inside the womb (uterus). It may be done for various reasons, including:  To evaluate abnormal bleeding, fibroid (benign, noncancerous) tumors, polyps, scar tissue (adhesions), and possibly cancer of the uterus.  To look for lumps (tumors) and other uterine growths.  To look for causes of why a woman cannot get pregnant (infertility), causes of recurrent loss of pregnancy (miscarriages), or a lost intrauterine device (IUD).  To perform a sterilization by blocking the fallopian tubes from inside the uterus. In this procedure, a thin, flexible tube with a tiny light and camera on the end of it (hysteroscope) is used to look inside the uterus. A hysteroscopy should be done right after a menstrual period to be sure you are not pregnant. LET [facility] PROVIDER KNOW ABOUT:   Any allergies you have.  All medicines you are taking, including vitamins, herbs, eye drops, creams, and over-the-counter medicines.  Previous problems you or members of your family have had with the use of anesthetics.  Any blood disorders you have.  Previous surgeries you have had.  Medical conditions you have. RISKS AND COMPLICATIONS  Generally, this is a safe procedure. However, as with any procedure, complications can occur. Possible complications include:  Putting a hole in the uterus.  Excessive bleeding.  Infection.  Damage to the cervix.  Injury to other organs.  Allergic reaction to medicines.  Too  much fluid used in the uterus for the procedure. BEFORE THE PROCEDURE   Ask your health care provider about changing or stopping any regular medicines.  Do not take aspirin or blood thinners for 1 week before the procedure, or as directed by your health care provider. These can cause bleeding.  If you smoke, do not smoke for 2 weeks before the procedure.  In some cases, a medicine is placed in the cervix the day before the procedure. This medicine makes the cervix have a larger opening (dilate). This makes it easier for the instrument to be inserted into the uterus during the procedure.  Do not eat or drink anything for at least 8 hours before the surgery.  Arrange for someone to take you home after the procedure. PROCEDURE   You may be given a medicine to relax you (sedative). You may also be given one of the following:  A medicine that numbs the area around the cervix (local anesthetic).  A medicine that makes you sleep through the procedure (general anesthetic).  The hysteroscope is inserted through the vagina into the uterus. The camera on the hysteroscope sends a picture to a TV screen. This gives the surgeon a good view inside the uterus.  During the procedure, air or a liquid is put into the uterus, which allows the surgeon to see better.  Sometimes, tissue is gently scraped from inside the uterus. These tissue samples are sent to a lab for testing. AFTER THE PROCEDURE   If you had a general anesthetic, you may be groggy for a couple hours  after the procedure.  If you had a local anesthetic, you will be able to go home as soon as you are stable and feel ready.  You may have some cramping. This normally lasts for a couple days.  You may have bleeding, which varies from light spotting for a few days to menstrual-like bleeding for 3 7 days. This is normal.  If your test results are not back during the visit, make an appointment with your health care provider to find out the  results. Document Released: 09/10/2000 Document Revised: 03/25/2013 Document Reviewed: 01/01/2013 [facility] Patient Information 2014 [place]ExitCare, [place]LL[place].

## 2013-10-02 NOTE — Progress Notes (Signed)
This chart was scribed by Bennett Scrapehristina Taylor, Medical Scribe, for Dr. Christin BachJohn Andra Matsuo on 10/02/13 at 11:40 AM. This chart was reviewed by Dr. Christin BachJohn Marlen Koman and is accurate.   [name], female   DOB: [birth year], 53 y.o.   MRN: [id number]  Pt dx'd with endometrial polyp, denies any vaginal bleeding. LNMP was 6 months ago. H/o 1 prior endometrial polyp.  09/23/13-Transvaginal u/s performed, retroverted uterus noted with Endometrium-7.358mm with 2 hyperechoic areas noted within, bilateral adnexa/ovaries appears wnl, no free fluid noted   Endometrial Biopsy: Patient given informed consent, signed copy in the chart, time out was performed. Time out taken. The patient was placed in the lithotomy position and the cervix brought into view with sterile speculum. Portio of cervix cleansed x 2 with betadine swabs. A tenaculum was placed in the anterior lip of the cervix. Procedure was discontinued due to discomfort. All equipment was removed and accounted for.   Patient given post procedure instructions.   A: Incomplete Endometrial Biopsy P: Hysteroscopy D&C for removal of polyps  Call Dr. Richardson LandrySollenberger, pt's endocrinologist, to discuss DM medications for surgery [phone]

## 2013-10-05 ENCOUNTER — Encounter (HOSPITAL_COMMUNITY): Payer: Self-pay | Admitting: Pharmacy Technician

## 2013-10-13 NOTE — Patient Instructions (Addendum)
Your procedure is scheduled on: 10/20/2013  Report to [facility] at   9:30  AM.  Call this number if you have problems the morning of surgery: [phone]   Remember:   Do not drink or eat food:After Midnight.  :  Take these medicines the morning of surgery with A SIP OF WATER: Lisinopril and omeprazole,oxazepam   Do not wear jewelry, make-up or nail polish.  Do not wear lotions, powders, or perfumes. You may wear deodorant.  Do not shave 48 hours prior to surgery. Men may shave face and neck.  Do not bring valuables to the hospital.  Contacts, dentures or bridgework may not be worn into surgery.  Leave suitcase in the car. After surgery it may be brought to your room.  For patients admitted to the hospital, checkout time is 11:00 AM the day of discharge.   Patients discharged the day of surgery will not be allowed to drive home.    Special Instructions: Shower using CHG night before surgery and shower the day of surgery use CHG.  Use special wash - you have one bottle of CHG for all showers.  You should use approximately 1/2 of the bottle for each shower.   Please read over the following fact sheets that you were given: Pain Booklet, MRSA Information, Surgical Site Infection Prevention and Care and Recovery After Surgery  Hysteroscopy, Care After Refer to this sheet in the next few weeks. These instructions provide you with information on caring for yourself after your procedure. Your health care provider may also give you more specific instructions. Your treatment has been planned according to current medical practices, but problems sometimes occur. Call your health care provider if you have any problems or questions after your procedure.  WHAT TO EXPECT AFTER THE PROCEDURE After your procedure, it is typical to have the following:  You may have some cramping. This normally lasts for a couple days.  You may have bleeding. This can vary from light spotting for a few days to  menstrual-like bleeding for 3 7 days. HOME CARE INSTRUCTIONS  Rest for the first 1 2 days after the procedure.  Only take over-the-counter or prescription medicines as directed by your health care provider. Do not take aspirin. It can increase the chances of bleeding.  Take showers instead of baths for 2 weeks or as directed by your health care provider.  Do not drive for 24 hours or as directed.  Do not drink alcohol while taking pain medicine.  Do not use tampons, douche, or have sexual intercourse for 2 weeks or until your health care provider says it is okay.  Take your temperature twice a day for 4 5 days. Write it down each time.  Follow your health care provider's advice about diet, exercise, and lifting.  If you develop constipation, you may:  Take a mild laxative if your health care provider approves.  Add bran foods to your diet.  Drink enough fluids to keep your urine clear or pale yellow.  Try to have someone with you or available to you for the first 24 48 hours, especially if you were given a general anesthetic.  Follow up with your health care provider as directed. SEEK MEDICAL CARE IF:  You feel dizzy or lightheaded.  You feel sick to your stomach (nauseous).  You have abnormal vaginal discharge.  You have a rash.  You have pain that is not controlled with medicine. SEEK IMMEDIATE MEDICAL CARE IF:  You have bleeding  that is heavier than a normal menstrual period.  You have a fever.  You have increasing cramps or pain, not controlled with medicine.  You have new belly (abdominal) pain.  You pass out.  You have pain in the tops of your shoulders (shoulder strap areas).  You have shortness of breath. Document Released: 03/25/2013 Document Reviewed: 01/01/2013 [facility]Exit[facility] Patient Information 2014 [place]ExitCare, [place]LLC. PATIENT INSTRUCTIONS POST-ANESTHESIA  IMMEDIATELY FOLLOWING SURGERY:  Do not drive or operate machinery for the first twenty four hours  after surgery.  Do not make any important decisions for twenty four hours after surgery or while taking narcotic pain medications or sedatives.  If you develop intractable nausea and vomiting or a severe headache please notify your doctor immediately.  FOLLOW-UP:  Please make an appointment with your surgeon as instructed. You do not need to follow up with anesthesia unless specifically instructed to do so.  WOUND CARE INSTRUCTIONS (if applicable):  Keep a dry clean dressing on the anesthesia/puncture wound site if there is drainage.  Once the wound has quit draining you may leave it open to air.  Generally you should leave the bandage intact for twenty four hours unless there is drainage.  If the epidural site drains for more than 36-48 hours please call the anesthesia department.  QUESTIONS?:  Please feel free to call your physician or the hospital operator if you have any questions, and they will be happy to assist you.

## 2013-10-14 ENCOUNTER — Encounter (HOSPITAL_COMMUNITY)
Admission: RE | Admit: 2013-10-14 | Discharge: 2013-10-14 | Disposition: A | Discharge status: Home / Self Care | Payer: Medicare Other | Source: Ambulatory Visit | Attending: Obstetrics and Gynecology | Admitting: Obstetrics and Gynecology

## 2013-10-14 ENCOUNTER — Encounter (HOSPITAL_COMMUNITY): Payer: Self-pay

## 2013-10-14 ENCOUNTER — Other Ambulatory Visit: Payer: Self-pay | Admitting: Obstetrics and Gynecology

## 2013-10-14 DIAGNOSIS — Z01812 Encounter for preprocedural laboratory examination: Secondary | ICD-10-CM | POA: Insufficient documentation

## 2013-10-14 HISTORY — DX: Other specified postprocedural states: R11.2

## 2013-10-14 HISTORY — DX: Gastro-esophageal reflux disease without esophagitis: K21.9

## 2013-10-14 HISTORY — DX: Essential (primary) hypertension: I10

## 2013-10-14 HISTORY — DX: Other specified postprocedural states: Z98.890

## 2013-10-14 LAB — COMPREHENSIVE METABOLIC PANEL
ALBUMIN: 4.2 g/dL (ref 3.5–5.2)
ALK PHOS: 77 U/L (ref 39–117)
ALT: 24 U/L (ref 0–35)
AST: 21 U/L (ref 0–37)
BILIRUBIN TOTAL: 0.3 mg/dL (ref 0.3–1.2)
BUN: 21 mg/dL (ref 6–23)
CO2: 29 mEq/L (ref 19–32)
Calcium: 9.9 mg/dL (ref 8.4–10.5)
Chloride: 100 mEq/L (ref 96–112)
Creatinine, Ser: 0.98 mg/dL (ref 0.50–1.10)
GFR calc Af Amer: 77 mL/min — ABNORMAL LOW (ref >90)
GFR, EST NON AFRICAN AMERICAN: 66 mL/min — AB (ref >90)
GLUCOSE: 107 mg/dL — AB (ref 70–99)
Potassium: 4.6 mEq/L (ref 3.7–5.3)
Sodium: 141 mEq/L (ref 137–147)
Total Protein: 7.4 g/dL (ref 6.0–8.3)

## 2013-10-14 LAB — CBC
HCT: 36.4 % (ref 36.0–46.0)
Hemoglobin: 12.4 g/dL (ref 12.0–15.0)
MCH: 30 pg (ref 26.0–34.0)
MCHC: 34.1 g/dL (ref 30.0–36.0)
MCV: 88.1 fL (ref 78.0–100.0)
Platelets: 234 10*3/uL (ref 150–400)
RBC: 4.13 MIL/uL (ref 3.87–5.11)
RDW: 12.6 % (ref 11.5–15.5)
WBC: 7.7 10*3/uL (ref 4.0–10.5)

## 2013-10-14 LAB — HCG, SERUM, QUALITATIVE: Preg, Serum: NEGATIVE

## 2013-10-14 NOTE — Pre-Procedure Instructions (Signed)
Patient given information to sign up for my chart at home. 

## 2013-10-15 LAB — HEMOGLOBIN A1C
HEMOGLOBIN A1C: 7.7 % — AB (ref <5.7)
Mean Plasma Glucose: 174 mg/dL — ABNORMAL HIGH (ref <117)

## 2013-10-19 NOTE — H&P (Signed)
Carol Jensen is an 53 y.o. female. She is admitted thru day surgery for hysteroscopy dilation and curettage with removal of endometrial polyps, noted on u/s 09/23/13, with 2 hyperecohoic areas on u/s. She had 1 prior endometrial polyp removed in past.  Pertinent Gynecological History: Menses: post-menopausal Bleeding: none  X 6 months. Contraception: post menopausal status DES exposure: unknown Blood transfusions: none Sexually transmitted diseases: no past history Previous GYN Procedures: polypectomy  Last mammogram: normal Date: 04/10/13 Last pap: normal Date: 08/29/13 OB History: G, P   Menstrual History: Menarche age:  No LMP recorded. Patient is not currently having periods (Reason: Perimenopausal).    Past Medical History  Diagnosis Date  . Anxiety   . Arthritis   . Diabetes mellitus   . Diabetic retinopathy   . Mental disorder     anxiety  . Gum disease   . Diabetes 09/08/2013  . Peri-menopause 09/08/2013  . Unspecified symptom associated with female genital organs 09/08/2013  . Hypertension   . GERD (gastroesophageal reflux disease)   . PONV (postoperative nausea and vomiting)     Past Surgical History  Procedure Laterality Date  . Hand surgery Left     trigger finger  . Shoulder surgery Left 2002  . Right shoulder   2011, 2012  . 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 Asthma Mother   . Hyperlipidemia Mother   . Heart attack Father   . Heart disease Father   . Asthma Brother   . Cancer Maternal Aunt     pancreatic  . Hypertension Maternal Uncle   . Heart disease Maternal Uncle   . Diabetes Paternal Aunt   . Hypertension Paternal Aunt   . Diabetes Paternal Uncle   . Hypertension Paternal Uncle   . Diabetes Maternal Grandmother     Social History:  reports that she has never smoked. She has never used smokeless tobacco. She reports that she does not drink alcohol or  use illicit drugs.  Allergies: No Known Allergies  No prescriptions prior to admission    ROS She is diabetic on an insulin pump.She is complaining of hot flashes,night sweats,moody at times and nausea at times.LMP 03/24/13.Has had mammogram and dexa, A1c in 7's.Had normal EKG 7/14.    There were no vitals taken for this visit. Physical Exam Physical Exam:BP 108/60  Pulse 76  Ht 5\' 4"  (1.626 m)  Wt 227 lb (102.967 kg)  BMI 38.95 kg/m2  LMP 03/24/2013  General: Well developed, well nourished, no acute distress  Skin: Warm and dry  Neck: Midline trachea, normal thyroid  Lungs; Clear to auscultation bilaterally  Breast: No dominant palpable mass, retraction, or nipple discharge,firm  Cardiovascular: Regular rate and rhythm  Abdomen: Soft, non tender, no hepatosplenomegaly  Pelvic: External genitalia is normal in appearance. The vagina is normal in appearance. The cervix is smooth,pap with HPV performed. Uterus is felt to be normal size, shape, and contour. No adnexal masses, has tenderness on palpation.  Rectal: Good sphincter tone, no polyps, or hemorrhoids felt. Hemoccult negative.  Extremities: No swelling or varicosities noted  Psych: Alert and cooperative,seems happy   No results found for this or any previous visit (from the past 24 hour(s)).  No results found.  Assessment/Plan: Endometrial polyps, recurrent Hysteroscopic removal on 10/20/13  Carol BurrowJohn V Fredrika Jensen 10/19/2013, 10:36 PM

## 2013-10-20 ENCOUNTER — Encounter (HOSPITAL_COMMUNITY)
Admission: RE | Disposition: A | Discharge status: Home / Self Care | Payer: Self-pay | Source: Ambulatory Visit | Attending: Obstetrics and Gynecology

## 2013-10-20 ENCOUNTER — Ambulatory Visit (HOSPITAL_COMMUNITY): Payer: Medicare Other | Admitting: Anesthesiology

## 2013-10-20 ENCOUNTER — Encounter (HOSPITAL_COMMUNITY): Payer: Medicare Other | Admitting: Anesthesiology

## 2013-10-20 ENCOUNTER — Ambulatory Visit (HOSPITAL_COMMUNITY)
Admission: RE | Admit: 2013-10-20 | Discharge: 2013-10-20 | Disposition: A | Discharge status: Home / Self Care | Payer: Medicare Other | Source: Ambulatory Visit | Attending: Obstetrics and Gynecology | Admitting: Obstetrics and Gynecology

## 2013-10-20 ENCOUNTER — Encounter (HOSPITAL_COMMUNITY): Payer: Self-pay | Admitting: *Deleted

## 2013-10-20 DIAGNOSIS — E669 Obesity, unspecified: Secondary | ICD-10-CM | POA: Insufficient documentation

## 2013-10-20 DIAGNOSIS — N840 Polyp of corpus uteri: Secondary | ICD-10-CM | POA: Diagnosis present

## 2013-10-20 DIAGNOSIS — I10 Essential (primary) hypertension: Secondary | ICD-10-CM | POA: Insufficient documentation

## 2013-10-20 DIAGNOSIS — E109 Type 1 diabetes mellitus without complications: Secondary | ICD-10-CM | POA: Insufficient documentation

## 2013-10-20 DIAGNOSIS — Z794 Long term (current) use of insulin: Secondary | ICD-10-CM | POA: Insufficient documentation

## 2013-10-20 DIAGNOSIS — Z6838 Body mass index (BMI) 38.0-38.9, adult: Secondary | ICD-10-CM | POA: Insufficient documentation

## 2013-10-20 DIAGNOSIS — Z9641 Presence of insulin pump (external) (internal): Secondary | ICD-10-CM | POA: Insufficient documentation

## 2013-10-20 HISTORY — PX: HYSTEROSCOPY WITH D & C: SHX1775

## 2013-10-20 HISTORY — PX: POLYPECTOMY: SHX5525

## 2013-10-20 LAB — GLUCOSE, CAPILLARY
GLUCOSE-CAPILLARY: 86 mg/dL (ref 70–99)
GLUCOSE-CAPILLARY: 199 mg/dL — AB (ref 70–99)
GLUCOSE-CAPILLARY: 295 mg/dL — AB (ref 70–99)

## 2013-10-20 SURGERY — DILATATION AND CURETTAGE /HYSTEROSCOPY
Anesthesia: General | Site: Uterus

## 2013-10-20 MED ORDER — ONDANSETRON HCL 4 MG/2ML IJ SOLN
4.0000 mg | Freq: Once | INTRAMUSCULAR | Status: AC
  Administered 2013-10-20: 4 mg via INTRAVENOUS

## 2013-10-20 MED ORDER — LACTATED RINGERS IV SOLN
INTRAVENOUS | Status: DC
Start: 2013-10-20 — End: 2013-10-20
  Administered 2013-10-20: 1000 mL via INTRAVENOUS

## 2013-10-20 MED ORDER — FENTANYL CITRATE 0.05 MG/ML IJ SOLN
INTRAMUSCULAR | Status: AC
  Filled 2013-10-20: qty 2

## 2013-10-20 MED ORDER — GLYCOPYRROLATE 0.2 MG/ML IJ SOLN
INTRAMUSCULAR | Status: DC | PRN
  Administered 2013-10-20: 0.4 mg via INTRAVENOUS

## 2013-10-20 MED ORDER — PROPOFOL 10 MG/ML IV BOLUS
INTRAVENOUS | Status: DC | PRN
  Administered 2013-10-20: 50 mg via INTRAVENOUS
  Administered 2013-10-20: 150 mg via INTRAVENOUS

## 2013-10-20 MED ORDER — ROCURONIUM BROMIDE 50 MG/5ML IV SOLN
INTRAVENOUS | Status: AC
  Filled 2013-10-20: qty 1

## 2013-10-20 MED ORDER — FENTANYL CITRATE 0.05 MG/ML IJ SOLN
INTRAMUSCULAR | Status: DC | PRN
  Administered 2013-10-20: 100 ug via INTRAVENOUS
  Administered 2013-10-20 (×2): 50 ug via INTRAVENOUS

## 2013-10-20 MED ORDER — CEFAZOLIN SODIUM-DEXTROSE 2-3 GM-% IV SOLR
INTRAVENOUS | Status: AC
  Filled 2013-10-20: qty 50

## 2013-10-20 MED ORDER — DEXAMETHASONE SODIUM PHOSPHATE 4 MG/ML IJ SOLN
INTRAMUSCULAR | Status: AC
  Filled 2013-10-20: qty 1

## 2013-10-20 MED ORDER — GLYCOPYRROLATE 0.2 MG/ML IJ SOLN
INTRAMUSCULAR | Status: AC
  Filled 2013-10-20: qty 1

## 2013-10-20 MED ORDER — NEOSTIGMINE METHYLSULFATE 10 MG/10ML IV SOLN
INTRAVENOUS | Status: DC | PRN
  Administered 2013-10-20: 2 mg via INTRAVENOUS

## 2013-10-20 MED ORDER — SCOPOLAMINE 1 MG/3DAYS TD PT72
1.0000 | MEDICATED_PATCH | Freq: Once | TRANSDERMAL | Status: DC
  Administered 2013-10-20: 1.5 mg via TRANSDERMAL

## 2013-10-20 MED ORDER — MIDAZOLAM HCL 2 MG/2ML IJ SOLN
INTRAMUSCULAR | Status: AC
  Filled 2013-10-20: qty 2

## 2013-10-20 MED ORDER — MIDAZOLAM HCL 2 MG/2ML IJ SOLN
1.0000 mg | INTRAMUSCULAR | Status: DC | PRN
  Administered 2013-10-20 (×2): 2 mg via INTRAVENOUS
  Filled 2013-10-20: qty 2

## 2013-10-20 MED ORDER — DEXAMETHASONE SODIUM PHOSPHATE 4 MG/ML IJ SOLN
4.0000 mg | Freq: Once | INTRAMUSCULAR | Status: AC
  Administered 2013-10-20: 4 mg via INTRAVENOUS

## 2013-10-20 MED ORDER — LIDOCAINE HCL (PF) 1 % IJ SOLN
INTRAMUSCULAR | Status: AC
  Filled 2013-10-20: qty 5

## 2013-10-20 MED ORDER — SCOPOLAMINE 1 MG/3DAYS TD PT72
MEDICATED_PATCH | TRANSDERMAL | Status: AC
  Filled 2013-10-20: qty 1

## 2013-10-20 MED ORDER — MIDAZOLAM HCL 2 MG/2ML IJ SOLN
INTRAMUSCULAR | Status: DC | PRN
  Administered 2013-10-20: 2 mg via INTRAVENOUS

## 2013-10-20 MED ORDER — ONDANSETRON HCL 4 MG/2ML IJ SOLN
INTRAMUSCULAR | Status: AC
  Filled 2013-10-20: qty 2

## 2013-10-20 MED ORDER — ROCURONIUM BROMIDE 100 MG/10ML IV SOLN
INTRAVENOUS | Status: DC | PRN
  Administered 2013-10-20: 5 mg via INTRAVENOUS
  Administered 2013-10-20: 30 mg via INTRAVENOUS

## 2013-10-20 MED ORDER — BUPIVACAINE-EPINEPHRINE (PF) 0.5% -1:200000 IJ SOLN
INTRAMUSCULAR | Status: AC
  Filled 2013-10-20: qty 30

## 2013-10-20 MED ORDER — SODIUM CHLORIDE 0.9 % IR SOLN
Status: DC | PRN
Start: 2013-10-20 — End: 2013-10-20
  Administered 2013-10-20: 1000 mL

## 2013-10-20 MED ORDER — BUPIVACAINE-EPINEPHRINE 0.5% -1:200000 IJ SOLN
INTRAMUSCULAR | Status: DC | PRN
  Administered 2013-10-20: 17 mL

## 2013-10-20 MED ORDER — PROPOFOL 10 MG/ML IV EMUL
INTRAVENOUS | Status: AC
  Filled 2013-10-20: qty 20

## 2013-10-20 MED ORDER — DEXTROSE 50 % IV SOLN
12.5000 g | Freq: Once | INTRAVENOUS | Status: AC
  Administered 2013-10-20: 12.5 g via INTRAVENOUS

## 2013-10-20 MED ORDER — IBUPROFEN 600 MG PO TABS: 600.0000 mg | ORAL_TABLET | Freq: Four times a day (QID) | ORAL | Status: DC | PRN

## 2013-10-20 MED ORDER — CEFAZOLIN SODIUM-DEXTROSE 2-3 GM-% IV SOLR
2.0000 g | INTRAVENOUS | Status: AC
  Administered 2013-10-20: 2 g via INTRAVENOUS

## 2013-10-20 MED ORDER — GLYCOPYRROLATE 0.2 MG/ML IJ SOLN
0.2000 mg | Freq: Once | INTRAMUSCULAR | Status: AC
  Administered 2013-10-20: 0.2 mg via INTRAVENOUS

## 2013-10-20 MED ORDER — LIDOCAINE HCL (CARDIAC) 10 MG/ML IV SOLN
INTRAVENOUS | Status: DC | PRN
  Administered 2013-10-20: 10 mg via INTRAVENOUS

## 2013-10-20 MED ORDER — GLYCOPYRROLATE 0.2 MG/ML IJ SOLN
INTRAMUSCULAR | Status: AC
  Filled 2013-10-20: qty 2

## 2013-10-20 MED ORDER — DEXTROSE 50 % IV SOLN
INTRAVENOUS | Status: AC
  Filled 2013-10-20: qty 50

## 2013-10-20 SURGICAL SUPPLY — 32 items
BAG HAMPER (MISCELLANEOUS) ×2 IMPLANT
CLOTH BEACON ORANGE TIMEOUT ST (SAFETY) ×2 IMPLANT
COVER MAYO STAND XLG (DRAPE) ×2 IMPLANT
COVER SURGICAL LIGHT HANDLE (MISCELLANEOUS) ×2 IMPLANT
DECANTER SPIKE VIAL GLASS SM (MISCELLANEOUS) ×2 IMPLANT
FORMALIN 10 PREFIL 120ML (MISCELLANEOUS) ×2 IMPLANT
GLOVE BIOGEL PI IND STRL 7.0 (GLOVE) ×2 IMPLANT
GLOVE BIOGEL PI IND STRL 8.5 (GLOVE) ×1 IMPLANT
GLOVE BIOGEL PI IND STRL 9 (GLOVE) ×1 IMPLANT
GLOVE BIOGEL PI INDICATOR 7.0 (GLOVE) ×2
GLOVE BIOGEL PI INDICATOR 8.5 (GLOVE) ×1
GLOVE BIOGEL PI INDICATOR 9 (GLOVE) ×1
GLOVE ECLIPSE 6.5 STRL STRAW (GLOVE) ×2 IMPLANT
GLOVE ECLIPSE 8.5 STRL (GLOVE) ×2 IMPLANT
GLOVE ECLIPSE 9.0 STRL (GLOVE) ×2 IMPLANT
GLOVE INDICATOR STER SZ 9 (GLOVE) ×2 IMPLANT
GOWN SPEC L3 XXLG W/TWL (GOWN DISPOSABLE) ×2 IMPLANT
GOWN STRL REUS W/TWL LRG LVL3 (GOWN DISPOSABLE) ×2 IMPLANT
INST SET HYSTEROSCOPY (KITS) ×2 IMPLANT
IV NS 1000ML (IV SOLUTION) ×1
IV NS 1000ML BAXH (IV SOLUTION) ×1 IMPLANT
KIT ROOM TURNOVER APOR (KITS) ×2 IMPLANT
MANIFOLD NEPTUNE II (INSTRUMENTS) ×2 IMPLANT
NS IRRIG 1000ML POUR BTL (IV SOLUTION) ×2 IMPLANT
PACK PERI GYN (CUSTOM PROCEDURE TRAY) ×2 IMPLANT
PAD ARMBOARD 7.5X6 YLW CONV (MISCELLANEOUS) ×2 IMPLANT
PAD TELFA 3X4 1S STER (GAUZE/BANDAGES/DRESSINGS) ×2 IMPLANT
SET BASIN LINEN APH (SET/KITS/TRAYS/PACK) ×2 IMPLANT
SET CYSTO W/LG BORE CLAMP LF (SET/KITS/TRAYS/PACK) ×2 IMPLANT
SET IV ADMIN VERSALIGHT (MISCELLANEOUS) IMPLANT
SYR CONTROL 10ML LL (SYRINGE) ×2 IMPLANT
VERSALIGHT (MISCELLANEOUS) IMPLANT

## 2013-10-20 NOTE — Op Note (Signed)
10/20/2013  9:09 AM  PATIENT:  Carol Jensen  53 y.o. female  PRE-OPERATIVE DIAGNOSIS:  endometrial polyps  POST-OPERATIVE DIAGNOSIS:  endometrial polyps  PROCEDURE:  Procedure(s): DILATATION AND CURETTAGE /HYSTEROSCOPY (N/A) POLYPECTOMY (N/A)  SURGEON:  Surgeon(s) and Role:    * Tilda BurrowJohn V Chirstine Defrain, MD - Primary  PHYSICIAN ASSISTANT:   ASSISTANTS: Henderson CST    ANESTHESIA:   local and general  EBL:  Total I/O In: 900 [I.V.:900] Out: 0   Details of procedure: Patient was taken operating room prepped and draped for vaginal procedure with timeout conducted. Ancef was administered. This was grasped with single-tooth tenaculum the uterus sounded to 8 cm in retroverted position, dilated to 23 JamaicaFrench allowing introduction with some difficulty of the rigid 30 operative hysteroscope which showed the endometrial cavity and 2 polyps located in the left cornual area which were then extracted in several pieces with the operative biopsy scissors. Hysteroscopy confirmed that the uterus was slightly and completely evacuated, then the curettage assist with removal of the tissue fragments. The specimen was collected. The paracervical block was applied and patient went to recovery in stable condition

## 2013-10-20 NOTE — Anesthesia Preprocedure Evaluation (Signed)
Anesthesia Evaluation  Patient identified by MRN, date of birth, ID band Patient awake    Reviewed: Allergy & Precautions, H&P , NPO status , Patient's Chart, lab work & pertinent test results  History of Anesthesia Complications (+) PONV and history of anesthetic complications  Airway Mallampati: II TM Distance: >3 FB Neck ROM: Full    Dental  (+) Teeth Intact   Pulmonary neg pulmonary ROS,          Cardiovascular hypertension, Pt. on medications Rhythm:Regular Rate:Normal     Neuro/Psych PSYCHIATRIC DISORDERS Anxiety    GI/Hepatic GERD-  Medicated and Controlled,  Endo/Other  diabetes, Type 2, Insulin Dependent  Renal/GU      Musculoskeletal   Abdominal   Peds  Hematology   Anesthesia Other Findings   Reproductive/Obstetrics                           Anesthesia Physical Anesthesia Plan  ASA: III  Anesthesia Plan: General   Post-op Pain Management:    Induction: Intravenous, Rapid sequence and Cricoid pressure planned  Airway Management Planned: Oral ETT  Additional Equipment:   Intra-op Plan:   Post-operative Plan: Extubation in OR  Informed Consent: I have reviewed the patients History and Physical, chart, labs and discussed the procedure including the risks, benefits and alternatives for the proposed anesthesia with the patient or authorized representative who has indicated his/her understanding and acceptance.     Plan Discussed with:   Anesthesia Plan Comments:         Anesthesia Quick Evaluation

## 2013-10-20 NOTE — Anesthesia Procedure Notes (Signed)
Procedure Name: Intubation Date/Time: 10/20/2013 7:45 AM Performed by: Franco NonesYATES, Dominic Rhome S Pre-anesthesia Checklist: Patient identified, Patient being monitored, Timeout performed, Emergency Drugs available and Suction available Patient Re-evaluated:Patient Re-evaluated prior to inductionOxygen Delivery Method: Circle System Utilized Preoxygenation: Pre-oxygenation with 100% oxygen Intubation Type: IV induction, Rapid sequence and Cricoid Pressure applied Ventilation: Mask ventilation without difficulty Laryngoscope Size: Miller and 2 Grade View: Grade I Tube type: Oral Tube size: 7.0 mm Number of attempts: 1 Airway Equipment and Method: stylet Placement Confirmation: ETT inserted through vocal cords under direct vision,  positive ETCO2 and breath sounds checked- equal and bilateral Secured at: 20 cm Tube secured with: Tape Dental Injury: Teeth and Oropharynx as per pre-operative assessment

## 2013-10-20 NOTE — Transfer of Care (Signed)
Immediate Anesthesia Transfer of Care Note  Patient: Carol Jensen  Procedure(s) Performed: Procedure(s) (LRB): DILATATION AND CURETTAGE /HYSTEROSCOPY (N/A) POLYPECTOMY (N/A)  Patient Location: PACU  Anesthesia Type: General  Level of Consciousness: awake  Airway & Oxygen Therapy: Patient Spontanous Breathing and non-rebreather face mask  Post-op Assessment: Report given to PACU RN, Post -op Vital signs reviewed and stable and Patient moving all extremities  Post vital signs: Reviewed and stable  Complications: No apparent anesthesia complications

## 2013-10-20 NOTE — Brief Op Note (Signed)
10/20/2013  9:09 AM  PATIENT:  Carol Jensen  53 y.o. female  PRE-OPERATIVE DIAGNOSIS:  endometrial polyps  POST-OPERATIVE DIAGNOSIS:  endometrial polyps  PROCEDURE:  Procedure(s): DILATATION AND CURETTAGE /HYSTEROSCOPY (N/A) POLYPECTOMY (N/A)  SURGEON:  Surgeon(s) and Role:    * Tilda BurrowJohn V Reznor Ferrando, MD - Primary  PHYSICIAN ASSISTANT:   ASSISTANTS: Henderson CST    ANESTHESIA:   local and general  EBL:  Total I/O In: 900 [I.V.:900] Out: 0   BLOOD ADMINISTERED:none  DRAINS: none   LOCAL MEDICATIONS USED:  BUPIVICAINE  and Amount: 17 ml  SPECIMEN:  Source of Specimen:  endometrial currettings, polyp  DISPOSITION OF SPECIMEN:  PATHOLOGY  COUNTS:  YES  TOURNIQUET:  * No tourniquets in log *  DICTATION: .Dragon Dictation  PLAN OF CARE: Discharge to home after PACU  PATIENT DISPOSITION:  PACU - hemodynamically stable.   Delay start of Pharmacological VTE agent (>24hrs) due to surgical blood loss or risk of bleeding: not applicable

## 2013-10-20 NOTE — Brief Op Note (Signed)
10/20/2013  11:59 AM  PATIENT:  Carol Jensen  53 y.o. female  PRE-OPERATIVE DIAGNOSIS:  endometrial polyps  POST-OPERATIVE DIAGNOSIS:  endometrial polyps  PROCEDURE:  Procedure(s): DILATATION AND CURETTAGE /HYSTEROSCOPY (N/A) POLYPECTOMY (N/A)  SURGEON:  Surgeon(s) and Role:    * Tilda BurrowJohn V Olla Delancey, MD - Primary  PHYSICIAN ASSISTANT:   ASSISTANTS: none   ANESTHESIA:   general and paracervical block  EBL:  Total I/O In: 1222 [P.O.:222; I.V.:1000] Out: 0   BLOOD ADMINISTERED:none  DRAINS: none   LOCAL MEDICATIONS USED:  MARCAINE    and Amount: 17 ml  SPECIMEN:  Source of Specimen:  Endometrial polyp and curettings  DISPOSITION OF SPECIMEN:  PATHOLOGY  COUNTS:  YES  TOURNIQUET:  * No tourniquets in log *  DICTATION: .Dragon Dictation  PLAN OF CARE: Discharge to home after PACU  PATIENT DISPOSITION:  PACU - hemodynamically stable.   Delay start of Pharmacological VTE agent (>24hrs) due to surgical blood loss or risk of bleeding: not applicable

## 2013-10-20 NOTE — Discharge Instructions (Signed)
Hysteroscopy, Care After  Refer to this sheet in the next few weeks. These instructions provide you with information on caring for yourself after your procedure. Your health care provider may also give you more specific instructions. Your treatment has been planned according to current medical practices, but problems sometimes occur. Call your health care provider if you have any problems or questions after your procedure.   WHAT TO EXPECT AFTER THE PROCEDURE  After your procedure, it is typical to have the following:  · You may have some cramping. This normally lasts for a couple days.  · You may have bleeding. This can vary from light spotting for a few days to menstrual-like bleeding for 3 7 days.  HOME CARE INSTRUCTIONS  · Rest for the first 1 2 days after the procedure.  · Only take over-the-counter or prescription medicines as directed by your health care provider. Do not take aspirin. It can increase the chances of bleeding.  · Take showers instead of baths for 2 weeks or as directed by your health care provider.  · Do not drive for 24 hours or as directed.  · Do not drink alcohol while taking pain medicine.  · Do not use tampons, douche, or have sexual intercourse for 2 weeks or until your health care provider says it is okay.  · Take your temperature twice a day for 4 5 days. Write it down each time.  · Follow your health care provider's advice about diet, exercise, and lifting.  · If you develop constipation, you may:  · Take a mild laxative if your health care provider approves.  · Add bran foods to your diet.  · Drink enough fluids to keep your urine clear or pale yellow.  · Try to have someone with you or available to you for the first 24 48 hours, especially if you were given a general anesthetic.  · Follow up with your health care provider as directed.  SEEK MEDICAL CARE IF:  · You feel dizzy or lightheaded.  · You feel sick to your stomach (nauseous).  · You have abnormal vaginal discharge.  · You  have a rash.  · You have pain that is not controlled with medicine.  SEEK IMMEDIATE MEDICAL CARE IF:  · You have bleeding that is heavier than a normal menstrual period.  · You have a fever.  · You have increasing cramps or pain, not controlled with medicine.  · You have new belly (abdominal) pain.  · You pass out.  · You have pain in the tops of your shoulders (shoulder strap areas).  · You have shortness of breath.  Document Released: 03/25/2013 Document Reviewed: 01/01/2013  ExitCare® Patient Information ©2014 Exit[employer], LLC.

## 2013-10-20 NOTE — Anesthesia Postprocedure Evaluation (Signed)
  Anesthesia Post-op Note  Patient: Carol Jensen  Procedure(s) Performed: Procedure(s): DILATATION AND CURETTAGE /HYSTEROSCOPY (N/A) POLYPECTOMY (N/A)  Patient Location: PACU  Anesthesia Type:General  Level of Consciousness: awake and patient cooperative  Airway and Oxygen Therapy: Patient Spontanous Breathing  Post-op Pain: mild  Post-op Assessment: Post-op Vital signs reviewed, Patient's Cardiovascular Status Stable, Respiratory Function Stable, Patent Airway and No signs of Nausea or vomiting  Post-op Vital Signs: Reviewed and stable  Last Vitals:  Filed Vitals:   10/20/13 0831  BP: 110/67  Pulse: 116  Temp: 36.7 C  Resp: 10    Complications: No apparent anesthesia complications

## 2013-10-22 ENCOUNTER — Encounter (HOSPITAL_COMMUNITY): Payer: Self-pay | Admitting: Obstetrics and Gynecology

## 2013-10-26 ENCOUNTER — Telehealth: Payer: Self-pay | Admitting: Obstetrics and Gynecology

## 2013-10-26 NOTE — Telephone Encounter (Signed)
Pt states that she had surgery last Tuesday and JVF told her that he would call either Friday or today with results. Pt advised message will be sent to JVF and that I will make him aware so he can call her today.

## 2013-10-26 NOTE — Telephone Encounter (Signed)
Benign results of biopsy. Left message as per pt prior advice.

## 2013-10-26 NOTE — Telephone Encounter (Signed)
No answer

## 2013-10-27 ENCOUNTER — Encounter: Payer: Medicare Other | Admitting: Obstetrics and Gynecology

## 2013-10-28 ENCOUNTER — Ambulatory Visit (INDEPENDENT_AMBULATORY_CARE_PROVIDER_SITE_OTHER): Payer: Medicare Other | Admitting: Obstetrics and Gynecology

## 2013-10-28 ENCOUNTER — Encounter: Payer: Self-pay | Admitting: Obstetrics and Gynecology

## 2013-10-28 VITALS — BP 110/70 | Ht 64.0 in | Wt 218.0 lb

## 2013-10-28 DIAGNOSIS — Z9889 Other specified postprocedural states: Secondary | ICD-10-CM

## 2013-10-28 NOTE — Progress Notes (Deleted)
This note was scribed for Christin BachJohn Ferguson, MD, by Bennett Scrapehristina Taylor, Medical Scribe on 10/28/13 at 10:30 AM. The information in this note was reviewed by Christin BachJohn Ferguson, MD, and is accurate.   Subjective:  Carol Jensen is a 53 y.o. female who presents to the clinic 1 week status post polypectomy and D&C   Endometrial polyp, and currettings - FRAGMENTS OF BENIGN ENDOMETRIAL POLYP. - NO ATYPIA, HYPERPLASIA OR MALIGNANCY.  Review of Systems Negative   She has been eating a regular diet without difficulty.   Bowel movements are normal. The patient is not having any pain.  Objective:  BP 110/70   Ht 5\' 4"  (1.626 m)   Wt 218 lb (98.884 kg)   BMI 37.40 kg/m2 Chaperone present for exam which was performed with pt's permission General:Well developed, well nourished.  No acute distress. Pelvic Exam: deferred   Assessment:  Post-Op 1 week s/p  polypectomy and D&C Doing well postoperatively.  Pt's questions answered to apparent satisfaction  Plan:  1.Wound care discussed   2. .Continue any current medications. 3. Activity restrictions: none 4. return to work: not applicable. 5. Follow up PRN or 1 year for a PAP smear  *note deleted due to incorrect saving

## 2013-10-29 NOTE — Progress Notes (Signed)
This note was scribed for Christin BachJohn Temitope Flammer, MD, by Bennett Scrapehristina Taylor, Medical Scribe on 10/28/13 at 10:30 AM. The information in this note was reviewed by Christin BachJohn Kal Chait, MD, and is accurate.   Subjective:  Babs R Statler is a 53 y.o. female who presents to the clinic 1 week status post polypectomy and D&C   Endometrial polyp, and currettings - FRAGMENTS OF BENIGN ENDOMETRIAL POLYP. - NO ATYPIA, HYPERPLASIA OR MALIGNANCY.  Review of Systems Negative   She has been eating a regular diet without difficulty.   Bowel movements are normal. The patient is not having any pain.  Objective:  BP 110/70  Ht 5\' 4"  (1.626 m)  Wt 218 lb (98.884 kg)  BMI 37.40 kg/m2 Chaperone present for exam which was performed with pt's permission General:Well developed, well nourished.  No acute distress. Pelvic Exam: deferred   Assessment:  Post-Op 1 week s/p  polypectomy and D&C Doing well postoperatively.  Pt's questions answered to apparent satisfaction  Plan:  1.Wound care discussed   2. .Continue any current medications. 3. Activity restrictions: none 4. return to work: not applicable. 5. Follow up PRN or 1 year for a PAP smear

## 2014-06-21 DIAGNOSIS — K529 Noninfective gastroenteritis and colitis, unspecified: Secondary | ICD-10-CM | POA: Diagnosis not present

## 2014-06-28 DIAGNOSIS — E108 Type 1 diabetes mellitus with unspecified complications: Secondary | ICD-10-CM | POA: Diagnosis not present

## 2014-06-28 DIAGNOSIS — E669 Obesity, unspecified: Secondary | ICD-10-CM | POA: Diagnosis not present

## 2014-06-28 DIAGNOSIS — I10 Essential (primary) hypertension: Secondary | ICD-10-CM | POA: Diagnosis not present

## 2014-06-28 DIAGNOSIS — E109 Type 1 diabetes mellitus without complications: Secondary | ICD-10-CM | POA: Diagnosis not present

## 2014-07-29 ENCOUNTER — Other Ambulatory Visit: Payer: Self-pay | Admitting: Obstetrics and Gynecology

## 2014-07-29 DIAGNOSIS — Z1231 Encounter for screening mammogram for malignant neoplasm of breast: Secondary | ICD-10-CM

## 2014-09-28 DIAGNOSIS — Z1329 Encounter for screening for other suspected endocrine disorder: Secondary | ICD-10-CM | POA: Diagnosis not present

## 2014-09-28 DIAGNOSIS — K219 Gastro-esophageal reflux disease without esophagitis: Secondary | ICD-10-CM | POA: Diagnosis not present

## 2014-09-28 DIAGNOSIS — E109 Type 1 diabetes mellitus without complications: Secondary | ICD-10-CM | POA: Diagnosis not present

## 2014-09-28 DIAGNOSIS — E0836 Diabetes mellitus due to underlying condition with diabetic cataract: Secondary | ICD-10-CM | POA: Diagnosis not present

## 2014-09-28 DIAGNOSIS — E108 Type 1 diabetes mellitus with unspecified complications: Secondary | ICD-10-CM | POA: Diagnosis not present

## 2014-10-20 ENCOUNTER — Inpatient Hospital Stay (HOSPITAL_COMMUNITY): Admission: RE | Admit: 2014-10-20 | Payer: Medicare Other | Source: Ambulatory Visit

## 2014-10-21 ENCOUNTER — Ambulatory Visit (HOSPITAL_COMMUNITY): Payer: Medicare Other

## 2014-10-21 ENCOUNTER — Ambulatory Visit (HOSPITAL_COMMUNITY)
Admission: RE | Admit: 2014-10-21 | Discharge: 2014-10-21 | Disposition: A | Discharge status: Home / Self Care | Payer: Medicare Other | Source: Ambulatory Visit | Attending: Obstetrics and Gynecology | Admitting: Obstetrics and Gynecology

## 2014-10-21 DIAGNOSIS — Z1231 Encounter for screening mammogram for malignant neoplasm of breast: Secondary | ICD-10-CM | POA: Insufficient documentation

## 2014-10-21 DIAGNOSIS — I10 Essential (primary) hypertension: Secondary | ICD-10-CM | POA: Diagnosis not present

## 2014-11-08 ENCOUNTER — Ambulatory Visit: Payer: Medicare Other | Admitting: Obstetrics and Gynecology

## 2014-11-22 ENCOUNTER — Ambulatory Visit: Payer: Medicare Other | Admitting: Obstetrics and Gynecology

## 2014-12-07 DIAGNOSIS — E10329 Type 1 diabetes mellitus with mild nonproliferative diabetic retinopathy without macular edema: Secondary | ICD-10-CM | POA: Diagnosis not present

## 2015-01-05 ENCOUNTER — Encounter: Payer: Self-pay | Admitting: Obstetrics and Gynecology

## 2015-01-05 ENCOUNTER — Ambulatory Visit (INDEPENDENT_AMBULATORY_CARE_PROVIDER_SITE_OTHER): Payer: Medicare Other | Admitting: Obstetrics and Gynecology

## 2015-01-05 VITALS — BP 120/58 | Ht 66.0 in | Wt 186.0 lb

## 2015-01-05 DIAGNOSIS — N840 Polyp of corpus uteri: Secondary | ICD-10-CM

## 2015-01-05 MED ORDER — CLOTRIMAZOLE-BETAMETHASONE 1-0.05 % EX CREA: TOPICAL_CREAM | Freq: Two times a day (BID) | CUTANEOUS | Status: DC

## 2015-01-05 NOTE — Progress Notes (Signed)
[name], female   DOB: 02-27-1961, 54 y.o.   MRN: [id number]   [facility]  Patient name: Carol Jensen MRN [id number]  Date of birth: 02-27-1961  CC & HPI:  Carol Jensen is a 54 y.o. female presenting for one year follow-up after DILATATION AND CURETTAGE /HYSTEROSCOPY, and POLYPECTOMY performed by Dr. Emelda FearFerguson, as well as meds refill. She denies any bleeding or problems since.  Pt reports losing 47lbs over the past year which has consequently reduced her glucose levels. However, pt notes that she plans to establish care with a new endocroontologist. Pt denies any new Sx.   ROS:  A complete 10 system review of systems was obtained and all systems are negative except as noted in the HPI and PMH.   Pertinent History Reviewed:   Reviewed: Significant for DM, D&C, and polypectomy.  Medical         Past Medical History  Diagnosis Date  . Anxiety   . Arthritis   . Diabetes mellitus   . Diabetic retinopathy   . Mental disorder     anxiety  . Gum disease   . Diabetes 09/08/2013  . Peri-menopause 09/08/2013  . Unspecified symptom associated with female genital organs 09/08/2013  . Hypertension   . GERD (gastroesophageal reflux disease)   . PONV (postoperative nausea and vomiting)                               Surgical Hx:    Past Surgical History  Procedure Laterality Date  . Hand surgery Left     trigger finger  . Shoulder surgery Left 2002  . Right shoulder   2011, 2012  . Eye surgery      detatchen retina-hemorrhages behind eyes-[facility]- 6 times  . Hysteroscopy w/d&c N/A 10/20/2013    Procedure: DILATATION AND CURETTAGE /HYSTEROSCOPY;  Surgeon: Tilda BurrowJohn V Kristin Barcus, MD;  Location: [facility];  Service: Gynecology;  Laterality: N/A;  . Polypectomy N/A 10/20/2013    Procedure: POLYPECTOMY;  Surgeon: Tilda BurrowJohn V Shaquandra Galano, MD;  Location: [facility];  Service: Gynecology;  Laterality: N/A;   Medications: Reviewed & Updated - see associated section                        Current outpatient prescriptions:  .  acetaminophen (TYLENOL) 325 MG tablet, Take 650 mg by mouth every 6 (six) hours as needed., Disp: , Rfl:  .  acetaminophen-codeine (TYLENOL #3) 300-30 MG per tablet, Take 0.5 tablets by mouth as needed for moderate pain., Disp: , Rfl:  .  hydrochlorothiazide (HYDRODIURIL) 25 MG tablet, Take 25 mg by mouth daily., Disp: , Rfl:  .  ibuprofen (ADVIL,MOTRIN) 600 MG tablet, Take 1 tablet (600 mg total) by mouth every 6 (six) hours as needed. (Patient taking differently: Take 800 mg by mouth every 6 (six) hours as needed. ), Disp: 30 tablet, Rfl: 1 .  Insulin Human (INSULIN PUMP) 100 unit/ml SOLN, Inject into the skin., Disp: , Rfl:  .  Inulin (PHILLIPS FIBER GOOD) 2 G CHEW, Chew by mouth as needed., Disp: , Rfl:  .  lisinopril (PRINIVIL,ZESTRIL) 10 MG tablet, Take 10 mg by mouth daily., Disp: , Rfl:  .  nystatin-triamcinolone ointment (MYCOLOG), Apply 1 application topically 2 (two) times daily., Disp: 30 g, Rfl: 3 .  oxazepam (SERAX) 15 MG capsule, Take 15 mg by mouth every 4 (four) hours as  needed for anxiety. , Disp: , Rfl:  .  ranitidine (ZANTAC) 150 MG tablet, Take 150 mg by mouth 2 (two) times daily., Disp: , Rfl:  .  vitamin E 400 UNIT capsule, Take 400 Units by mouth daily., Disp: , Rfl:    Social History: Reviewed -  reports that she has never smoked. She has never used smokeless tobacco.  Objective Findings:  Vitals: Blood pressure 120/58, height  (1.676 m), weight 186 lb (84.369 kg).  Physical Examination: General appearance - alert, well appearing, and in no distress, oriented to person, place, and time and overweight Mental status - alert, oriented to person, place, and time, normal mood, behavior, speech, dress, motor activity, and thought processes, affect appropriate to mood Pelvic - normal external genitalia, vulva, vagina, cervix, uterus and adnexa,  VULVA: normal appearing vulva with no masses, tenderness or lesions,  VAGINA: normal  appearing vagina with normal color and discharge, no lesions, CERVIX: normal appearing cervix without discharge or lesions,  UTERUS: uterus small in, shape, consistency and nontender,  ADNEXA: normal adnexa in size, nontender and no masses,  RECTAL: normal rectal, no masses Guaiac Negative.   Assessment & Plan:   A:  1. Normal follow-up sp hx of endometrial polyp 2. Pt raised question of nutrition/supplements.  P:   1. Contact for Dr. Fransico Him (endrocrinology)  2.  Refill of Nystatin   /mycolog 3. Plan includes: (1) yearly mammograms with PCP; (2) papsmear every 3 years; (4) yearly hemoccult cards 4. Advised Vitamin D supplements.    This chart was SCRIBED for Christin Bach, MD by Ronney Lion, ED Scribe. This patient was seen in room 1 and the patient's care was started at 10:46 AM.  . I personally performed the services described in this documentation, which was SCRIBED in my presence. The recorded information has been reviewed and considered accurate. It has been edited as necessary during review. Tilda Burrow, MD

## 2015-01-05 NOTE — Patient Instructions (Signed)
Dr Edrick OhGebrelaisse Nida , endocrinologist, [phone] , [phone]

## 2015-01-05 NOTE — Progress Notes (Signed)
Patient ID: Carol BenceMelia Jensen July, female   DOB: 28-May-1961, 54 y.o.   MRN: [id number] Pt here today for gyn visit. Pt states that she was told that she should come back in one year for a check up. Pt does not need a pap last pap was March of last year and was normal. Pt states that she needs a refill on her nystatin cream.

## 2015-01-06 NOTE — Progress Notes (Signed)
[name], female   DOB: 1960-08-06, 54 y.o.   MRN: [id number]

## 2015-03-23 DIAGNOSIS — E109 Type 1 diabetes mellitus without complications: Secondary | ICD-10-CM | POA: Diagnosis not present

## 2015-03-23 DIAGNOSIS — Z23 Encounter for immunization: Secondary | ICD-10-CM | POA: Diagnosis not present

## 2015-05-04 DIAGNOSIS — Z0101 Encounter for examination of eyes and vision with abnormal findings: Secondary | ICD-10-CM | POA: Diagnosis not present

## 2015-05-04 DIAGNOSIS — H2511 Age-related nuclear cataract, right eye: Secondary | ICD-10-CM | POA: Diagnosis not present

## 2015-05-04 DIAGNOSIS — Z961 Presence of intraocular lens: Secondary | ICD-10-CM | POA: Diagnosis not present

## 2015-05-04 DIAGNOSIS — Z9889 Other specified postprocedural states: Secondary | ICD-10-CM | POA: Diagnosis not present

## 2015-05-04 DIAGNOSIS — Z794 Long term (current) use of insulin: Secondary | ICD-10-CM | POA: Diagnosis not present

## 2015-05-04 DIAGNOSIS — E113593 Type 2 diabetes mellitus with proliferative diabetic retinopathy without macular edema, bilateral: Secondary | ICD-10-CM | POA: Diagnosis not present

## 2015-05-04 DIAGNOSIS — H25041 Posterior subcapsular polar age-related cataract, right eye: Secondary | ICD-10-CM | POA: Diagnosis not present

## 2015-05-04 LAB — HM DIABETES EYE EXAM

## 2015-07-19 DIAGNOSIS — E559 Vitamin D deficiency, unspecified: Secondary | ICD-10-CM | POA: Diagnosis not present

## 2015-07-19 DIAGNOSIS — Z9641 Presence of insulin pump (external) (internal): Secondary | ICD-10-CM | POA: Diagnosis not present

## 2015-07-19 DIAGNOSIS — Z1329 Encounter for screening for other suspected endocrine disorder: Secondary | ICD-10-CM | POA: Diagnosis not present

## 2015-07-19 DIAGNOSIS — E109 Type 1 diabetes mellitus without complications: Secondary | ICD-10-CM | POA: Diagnosis not present

## 2015-07-19 DIAGNOSIS — E039 Hypothyroidism, unspecified: Secondary | ICD-10-CM | POA: Diagnosis not present

## 2015-07-19 LAB — HEMOGLOBIN A1C: HEMOGLOBIN A1C: 7.9

## 2015-08-01 ENCOUNTER — Encounter: Payer: Self-pay | Admitting: Family Medicine

## 2015-08-01 ENCOUNTER — Ambulatory Visit (INDEPENDENT_AMBULATORY_CARE_PROVIDER_SITE_OTHER): Payer: Medicare Other | Admitting: Family Medicine

## 2015-08-01 ENCOUNTER — Telehealth: Payer: Self-pay | Admitting: Family Medicine

## 2015-08-01 VITALS — BP 117/70 | HR 87 | Temp 97.8°F | Ht 66.0 in | Wt 183.4 lb

## 2015-08-01 DIAGNOSIS — E103599 Type 1 diabetes mellitus with proliferative diabetic retinopathy without macular edema, unspecified eye: Secondary | ICD-10-CM | POA: Diagnosis not present

## 2015-08-01 DIAGNOSIS — F411 Generalized anxiety disorder: Secondary | ICD-10-CM | POA: Insufficient documentation

## 2015-08-01 DIAGNOSIS — I10 Essential (primary) hypertension: Secondary | ICD-10-CM

## 2015-08-01 DIAGNOSIS — E1159 Type 2 diabetes mellitus with other circulatory complications: Secondary | ICD-10-CM | POA: Insufficient documentation

## 2015-08-01 DIAGNOSIS — I152 Hypertension secondary to endocrine disorders: Secondary | ICD-10-CM | POA: Insufficient documentation

## 2015-08-01 MED ORDER — DULOXETINE HCL 30 MG PO CPEP: ORAL_CAPSULE | ORAL | Status: DC

## 2015-08-01 NOTE — Progress Notes (Signed)
   HPI  Patient presents today to establish care.  Patient explains that she has diabetes and has an on the insulin pump for several years. She's managed by an endocrinologist in [place]. Her last A1c was 7.9.  She has recently seen an eye doctor, this is noted in care everywhere. She has diabetic retinopathy.  She has severe anxiety and takes oxazepam 3 times daily. Has never tried any other medications for anxiety.    PMH: Smoking status noted Her past medical, surgical, social, family history reviewed and updated in EMR ROS: Per HPI  Objective: BP 117/70 mmHg  Pulse 87  Temp(Src) 97.8 F (36.6 C) (Oral)  Ht _0  (1.676 m)  Wt 183 lb 6.4 oz (83.19 kg)  BMI 29.62 kg/m2 Gen: NAD, alert, cooperative with exam HEENT: NCAT, MMM, TMs normal bilaterally, nares clear Neck: No thyromegaly CV: RRR, good S1/S2, no murmur Resp: CTABL, no wheezes, non-labored Abd: SNTND, BS present, no guarding or organomegaly Ext: No edema, warm Neuro: Alert and oriented, 2+ patellar tendon reflexes bilaterally, no gross defects  Assessment and plan:  # Type 1 diabetes Managed by endocrinology Route previous A1c 7.9 No changes  # Hypertension At goal Continue lisinopril and HCTZ GFR 60 Labs  # Anxiety Start Cymbalta Continue oxazepam, hold to 3 times daily. Hopefully can reduce with addition of SNRI Follow-up in 3-4 weeks Additional benefit from this due to chronic musculoskeletal pain she states has due to osteoarthritis     Meds ordered this encounter  Medications  . Blood Glucose Monitoring Suppl (ACCU-CHEK AVIVA PLUS) w/Device KIT    Sig: 1 Device by Does not apply route.  . Cholecalciferol (VITAMIN D3) 50000 units CAPS    Sig: Take by mouth.  . DULoxetine (CYMBALTA) 30 MG capsule    Sig: Take 1 daily for 2 weeks then 2 daily.    Dispense:  44 capsule    Refill:  [place], MD [facility] 08/01/2015, 2:06 PM

## 2015-08-01 NOTE — Patient Instructions (Addendum)
Great to meet you!  I have reviewed your labs, I saw your last blood work in April 2016, if there is more current ones at your endocrinologists office please have them sent to me.   Try cymbalta, 1 pill dailyf ro 2 weeks then 2 pills daily  Follow up in 3-4 weeks

## 2015-08-01 NOTE — Telephone Encounter (Signed)
Stp and reviewed directions for cymbalta and oxazepam. Pt voiced understanding.

## 2015-08-02 LAB — CMP14+EGFR
A/G RATIO: 1.7 (ref 1.1–2.5)
ALT: 17 IU/L (ref 0–32)
AST: 24 IU/L (ref 0–40)
Albumin: 4.5 g/dL (ref 3.5–5.5)
Alkaline Phosphatase: 84 IU/L (ref 39–117)
BILIRUBIN TOTAL: 0.3 mg/dL (ref 0.0–1.2)
BUN/Creatinine Ratio: 23 (ref 9–23)
BUN: 26 mg/dL — AB (ref 6–24)
CALCIUM: 10 mg/dL (ref 8.7–10.2)
CHLORIDE: 97 mmol/L (ref 96–106)
CO2: 22 mmol/L (ref 18–29)
Creatinine, Ser: 1.14 mg/dL — ABNORMAL HIGH (ref 0.57–1.00)
GFR calc Af Amer: 64 mL/min/{1.73_m2} (ref >59)
GFR, EST NON AFRICAN AMERICAN: 55 mL/min/{1.73_m2} — AB (ref >59)
GLUCOSE: 178 mg/dL — AB (ref 65–99)
Globulin, Total: 2.7 g/dL (ref 1.5–4.5)
POTASSIUM: 4.9 mmol/L (ref 3.5–5.2)
Sodium: 137 mmol/L (ref 134–144)
TOTAL PROTEIN: 7.2 g/dL (ref 6.0–8.5)

## 2015-08-03 ENCOUNTER — Other Ambulatory Visit: Payer: Self-pay

## 2015-08-03 MED ORDER — RANITIDINE HCL 150 MG PO TABS: 150.0000 mg | ORAL_TABLET | Freq: Two times a day (BID) | ORAL | Status: DC

## 2015-08-25 ENCOUNTER — Ambulatory Visit: Payer: Medicare Other | Admitting: Family Medicine

## 2015-08-29 ENCOUNTER — Ambulatory Visit (INDEPENDENT_AMBULATORY_CARE_PROVIDER_SITE_OTHER): Payer: Medicare Other | Admitting: Family Medicine

## 2015-08-29 ENCOUNTER — Encounter: Payer: Self-pay | Admitting: Family Medicine

## 2015-08-29 VITALS — BP 108/69 | HR 80 | Temp 97.0°F | Ht 66.0 in | Wt 181.0 lb

## 2015-08-29 DIAGNOSIS — F411 Generalized anxiety disorder: Secondary | ICD-10-CM | POA: Diagnosis not present

## 2015-08-29 DIAGNOSIS — Z01419 Encounter for gynecological examination (general) (routine) without abnormal findings: Secondary | ICD-10-CM | POA: Insufficient documentation

## 2015-08-29 DIAGNOSIS — Z0000 Encounter for general adult medical examination without abnormal findings: Secondary | ICD-10-CM

## 2015-08-29 DIAGNOSIS — K219 Gastro-esophageal reflux disease without esophagitis: Secondary | ICD-10-CM

## 2015-08-29 MED ORDER — DULOXETINE HCL 30 MG PO CPEP: 30.0000 mg | ORAL_CAPSULE | Freq: Every day | ORAL | Status: DC

## 2015-08-29 MED ORDER — OXAZEPAM 15 MG PO CAPS: 15.0000 mg | ORAL_CAPSULE | Freq: Three times a day (TID) | ORAL | Status: DC | PRN

## 2015-08-29 NOTE — Patient Instructions (Signed)
Great to see you!  Come back in 3 months  I am glad the cymbalta is helping

## 2015-08-29 NOTE — Progress Notes (Signed)
   HPI  Patient presents today here for follow-up anxiety.  Anxiety Patient's been taking Cymbalta every day, she has stayed atg daily as she was concerned that it was causing hypoglycemia. She states that her anxiety is much better controlled, she denies suicidal ideation No GI upset She has cut  Back on her oxazepam to 2 pills daily  Has had some hypo's to 30s and 40s, see endo- she will call them about this.   Does not want to get C scope yet  Zantac BID going ok for reflux  PMH: Smoking status noted ROS: Per HPI  Objective: BP 108/69 mmHg  Pulse 80  Temp(Src) 97 F (36.1 C) (Oral)  Ht 5\' 6"  (1.676 m)  Wt 181 lb (82.101 kg)  BMI 29.23 kg/m2 Gen: NAD, alert, cooperative with exam HEENT: NCAT, EOMI, PERRL CV: RRR, good S1/S2, no murmur Resp: CTABL, no wheezes, non-labored Ext: No edema, warm Neuro: Alert and oriented, No gross deficits  Assessment and plan:  # Anxiety Has decreased oxazepam use since satrting cymbalta Refilled both F/u 2-3 months  # Reflux Doing well with zantac, discussed dose  # HCM Recommended C scope- she is considering    Meds ordered this encounter  Medications  . Vitamin D, Ergocalciferol, (DRISDOL) 50000 units CAPS capsule    Sig: Take by mouth.  . insulin aspart (NOVOLOG) 100 UNIT/ML injection    Sig:   . DULoxetine (CYMBALTA) 30 MG capsule    Sig: Take 1 capsule (30 mg total) by mouth daily.    Dispense:  90 capsule    Refill:  3  . oxazepam (SERAX) 15 MG capsule    Sig: Take 1 capsule (15 mg total) by mouth every 8 (eight) hours as needed for anxiety.    Dispense:  90 capsule    Refill:  2    Murtis SinkSam Tariyah Pendry, MD [facility] 08/29/2015, 3:52 PM

## 2015-10-25 DIAGNOSIS — E559 Vitamin D deficiency, unspecified: Secondary | ICD-10-CM | POA: Diagnosis not present

## 2015-10-25 DIAGNOSIS — E109 Type 1 diabetes mellitus without complications: Secondary | ICD-10-CM | POA: Diagnosis not present

## 2015-10-25 DIAGNOSIS — Z9641 Presence of insulin pump (external) (internal): Secondary | ICD-10-CM | POA: Diagnosis not present

## 2015-12-02 ENCOUNTER — Encounter: Payer: Self-pay | Admitting: Family Medicine

## 2015-12-02 ENCOUNTER — Ambulatory Visit (INDEPENDENT_AMBULATORY_CARE_PROVIDER_SITE_OTHER): Payer: Medicare Other | Admitting: Family Medicine

## 2015-12-02 VITALS — BP 115/70 | HR 74 | Temp 97.6°F | Ht 66.0 in | Wt 181.0 lb

## 2015-12-02 DIAGNOSIS — J209 Acute bronchitis, unspecified: Secondary | ICD-10-CM

## 2015-12-02 DIAGNOSIS — F411 Generalized anxiety disorder: Secondary | ICD-10-CM | POA: Diagnosis not present

## 2015-12-02 DIAGNOSIS — I10 Essential (primary) hypertension: Secondary | ICD-10-CM | POA: Diagnosis not present

## 2015-12-02 DIAGNOSIS — E103599 Type 1 diabetes mellitus with proliferative diabetic retinopathy without macular edema, unspecified eye: Secondary | ICD-10-CM | POA: Diagnosis not present

## 2015-12-02 LAB — BAYER DCA HB A1C WAIVED: HB A1C (BAYER DCA - WAIVED): 7.7 % — ABNORMAL HIGH (ref <7.0)

## 2015-12-02 MED ORDER — OXAZEPAM 15 MG PO CAPS: 15.0000 mg | ORAL_CAPSULE | Freq: Three times a day (TID) | ORAL | Status: DC | PRN

## 2015-12-02 MED ORDER — AZITHROMYCIN 250 MG PO TABS: ORAL_TABLET | ORAL | Status: DC

## 2015-12-02 MED ORDER — LISINOPRIL 10 MG PO TABS: 10.0000 mg | ORAL_TABLET | Freq: Every day | ORAL | Status: DC

## 2015-12-02 NOTE — Progress Notes (Signed)
   HPI  Patient presents today here for follow-up.  Diabetes She has a history of type 1 diabetes, she is unsatisfied with her current endocrinologist She would like referral to Dr. Veva Holes in [place] medication compliance, she states that usually under 9:50 systolic No chest pain, headache,leg edema   Cough 2-3 weeks, started after her mother was admitted to the hospital and then a rehabilitation facility She states that over the last few days it's gotten worse, she's also get mild shortness of breath, malaise. She's tolerating food and fluids normally. Mild central chest pain with deep inspiration  Anxiety Taking Cymbalta, doing well No suicidal thoughts Has cut back on benzodiazepines 1 pill daily, previously taking 3 daily before starting Cymbalta  PMH: Smoking status noted ROS: Per HPI  Objective: BP 115/70 mmHg  Pulse 74  Temp(Src) 97.6 F (36.4 C) (Oral)  Ht '5\' 6"'$  (1.676 m)  Wt 181 lb (82.101 kg)  BMI 29.23 kg/m2 Gen: NAD, alert, cooperative with exam HEENT: NCAT CV: RRR, good S1/S2, no murmur Resp: CTABL, no wheezes, non-labored Ext: No edema, warm Neuro: Alert and oriented, No gross deficits  Diabetic Foot Exam - Simple   Simple Foot Form  Visual Inspection  No deformities, no ulcerations, no other skin breakdown bilaterally:  Yes  Sensation Testing  See comments:  Yes  Pulse Check  Posterior Tibialis and Dorsalis pulse intact bilaterally:  Yes  Comments  Sensation intact to monofilament throughout except for heels bilaterally       Assessment and plan:  # Acute bronchitis Given 3 weeks of cough and exposure to many healthcare facilities I'm covering for atypical pneumonia Azithromycin Discussed possible viral pneumonia and possible 6-12 week healing period  # Type 1 diabetes A1c 7.7 Referring 2 new endocrinologist She is on insulin pump Physical exam as above  # Generalized anxiety disorder Continue Cymbalta, doing  well Refilled oxazepam, reduce to 60 pills a month  # Essential hypertension Well-controlled Her endocrinologist recommended increasing lisinopril to 20 mg, I recommended staying at 10 mg. Albumin is pending today, this could explain increasing ACE inhibitor     Orders Placed This Encounter  Procedures  . Bayer DCA Hb A1c Waived  . CMP14+EGFR  . Lipid panel  . Thyroid Panel With TSH  . Microalbumin / creatinine urine ratio  . Ambulatory referral to Endocrinology    Referral Priority:  Routine    Referral Type:  Consultation    Referral Reason:  Specialty Services Required    Number of Visits Requested:  1    Meds ordered this encounter  Medications  . Blood Glucose Monitoring Suppl (BAYER CONTOUR NEXT LINK) w/Device KIT    Sig: by Does not apply route.  Marland Kitchen oxazepam (SERAX) 15 MG capsule    Sig: Take 1 capsule (15 mg total) by mouth every 8 (eight) hours as needed for anxiety.    Dispense:  60 capsule    Refill:  2  . lisinopril (PRINIVIL,ZESTRIL) 10 MG tablet    Sig: Take 1 tablet (10 mg total) by mouth daily.    Dispense:  90 tablet    Refill:  3  . azithromycin (ZITHROMAX) 250 MG tablet    Sig: Take 2 tablets on day 1 and 1 tablet daily after that    Dispense:  6 tablet    Refill:  0    Laroy Apple, MD [facility] 12/02/2015, 12:06 PM

## 2015-12-02 NOTE — Patient Instructions (Signed)

## 2015-12-03 LAB — THYROID PANEL WITH TSH
FREE THYROXINE INDEX: 1.9 (ref 1.2–4.9)
T3 Uptake Ratio: 28 % (ref 24–39)
T4 TOTAL: 6.7 ug/dL (ref 4.5–12.0)
TSH: 1.06 u[IU]/mL (ref 0.450–4.500)

## 2015-12-03 LAB — CMP14+EGFR
A/G RATIO: 1.7 (ref 1.2–2.2)
ALBUMIN: 4.1 g/dL (ref 3.5–5.5)
ALT: 17 IU/L (ref 0–32)
AST: 20 IU/L (ref 0–40)
Alkaline Phosphatase: 81 IU/L (ref 39–117)
BILIRUBIN TOTAL: 0.3 mg/dL (ref 0.0–1.2)
BUN / CREAT RATIO: 21 (ref 9–23)
BUN: 24 mg/dL (ref 6–24)
CHLORIDE: 99 mmol/L (ref 96–106)
CO2: 23 mmol/L (ref 18–29)
Calcium: 9 mg/dL (ref 8.7–10.2)
Creatinine, Ser: 1.13 mg/dL — ABNORMAL HIGH (ref 0.57–1.00)
GFR, EST AFRICAN AMERICAN: 64 mL/min/{1.73_m2} (ref >59)
GFR, EST NON AFRICAN AMERICAN: 56 mL/min/{1.73_m2} — AB (ref >59)
Globulin, Total: 2.4 g/dL (ref 1.5–4.5)
Glucose: 298 mg/dL — ABNORMAL HIGH (ref 65–99)
POTASSIUM: 4.8 mmol/L (ref 3.5–5.2)
Sodium: 136 mmol/L (ref 134–144)
TOTAL PROTEIN: 6.5 g/dL (ref 6.0–8.5)

## 2015-12-03 LAB — MICROALBUMIN / CREATININE URINE RATIO
CREATININE, UR: 68.6 mg/dL
MICROALB/CREAT RATIO: 6 mg/g creat (ref 0.0–30.0)
Microalbumin, Urine: 4.1 ug/mL

## 2015-12-03 LAB — LIPID PANEL
CHOL/HDL RATIO: 1.5 ratio (ref 0.0–4.4)
Cholesterol, Total: 123 mg/dL (ref 100–199)
HDL: 80 mg/dL (ref >39)
LDL Calculated: 31 mg/dL (ref 0–99)
Triglycerides: 62 mg/dL (ref 0–149)
VLDL CHOLESTEROL CAL: 12 mg/dL (ref 5–40)

## 2015-12-09 ENCOUNTER — Other Ambulatory Visit: Payer: Self-pay | Admitting: Family Medicine

## 2015-12-09 ENCOUNTER — Telehealth: Payer: Self-pay | Admitting: Family Medicine

## 2015-12-09 MED ORDER — BENZONATATE 200 MG PO CAPS: 200.0000 mg | ORAL_CAPSULE | Freq: Two times a day (BID) | ORAL | Status: DC | PRN

## 2015-12-09 NOTE — Telephone Encounter (Signed)
Pt aware.

## 2015-12-09 NOTE — Telephone Encounter (Signed)
[name], MD [facility] 12/09/2015, 10:21 AM

## 2015-12-22 ENCOUNTER — Encounter: Payer: Self-pay | Admitting: *Deleted

## 2016-01-02 ENCOUNTER — Other Ambulatory Visit: Payer: Self-pay | Admitting: Family Medicine

## 2016-01-10 DIAGNOSIS — E103599 Type 1 diabetes mellitus with proliferative diabetic retinopathy without macular edema, unspecified eye: Secondary | ICD-10-CM | POA: Diagnosis not present

## 2016-01-10 DIAGNOSIS — E109 Type 1 diabetes mellitus without complications: Secondary | ICD-10-CM | POA: Diagnosis not present

## 2016-03-08 ENCOUNTER — Ambulatory Visit (INDEPENDENT_AMBULATORY_CARE_PROVIDER_SITE_OTHER): Payer: Medicare Other | Admitting: Family Medicine

## 2016-03-08 ENCOUNTER — Encounter: Payer: Self-pay | Admitting: Family Medicine

## 2016-03-08 VITALS — BP 104/63 | HR 74 | Temp 97.7°F | Ht 66.0 in | Wt 183.0 lb

## 2016-03-08 DIAGNOSIS — N183 Chronic kidney disease, stage 3 unspecified: Secondary | ICD-10-CM | POA: Insufficient documentation

## 2016-03-08 DIAGNOSIS — E559 Vitamin D deficiency, unspecified: Secondary | ICD-10-CM | POA: Insufficient documentation

## 2016-03-08 DIAGNOSIS — E1022 Type 1 diabetes mellitus with diabetic chronic kidney disease: Secondary | ICD-10-CM | POA: Diagnosis not present

## 2016-03-08 DIAGNOSIS — E103599 Type 1 diabetes mellitus with proliferative diabetic retinopathy without macular edema, unspecified eye: Secondary | ICD-10-CM | POA: Diagnosis not present

## 2016-03-08 DIAGNOSIS — F411 Generalized anxiety disorder: Secondary | ICD-10-CM

## 2016-03-08 DIAGNOSIS — Z23 Encounter for immunization: Secondary | ICD-10-CM

## 2016-03-08 DIAGNOSIS — Z0000 Encounter for general adult medical examination without abnormal findings: Secondary | ICD-10-CM

## 2016-03-08 LAB — BAYER DCA HB A1C WAIVED: HB A1C: 8 % — AB (ref <7.0)

## 2016-03-08 MED ORDER — OXAZEPAM 15 MG PO CAPS: 15.0000 mg | ORAL_CAPSULE | Freq: Three times a day (TID) | ORAL | 2 refills | Status: DC | PRN

## 2016-03-08 NOTE — Progress Notes (Signed)
   HPI  Patient presents today to discuss chronic medical problems.  Anxiety Doing well, taking on average 1 oxazepam per day. Good medication compliance with Cymbalta No suicidal thoughts or depression.  Stage III kidney disease, understands implications of diagnosis. No questions.  Type 1 diabetes Feels like her control is improving, she is very happy with her new endocrinologist Has mild neuropathy.  Vitamin D deficiency, currently taking 50,000 units every other week.   PMH: Smoking status noted ROS: Per HPI  Objective: BP 104/63 (BP Location: Right Arm, Patient Position: Sitting, Cuff Size: Normal)   Pulse 74   Temp 97.7 F (36.5 C) (Oral)   Ht '5\' 6"'$  (1.676 m)   Wt 183 lb (83 kg)   BMI 29.54 kg/m  Gen: NAD, alert, cooperative with exam HEENT: NCAT CV: RRR, good S1/S2, no murmur Resp: CTABL, no wheezes, non-labored Ext: No edema, warm Neuro: Alert and oriented, No gross deficits  Diabetic Foot Exam - Simple   Simple Foot Form Visual Inspection No deformities, no ulcerations, no other skin breakdown bilaterally:  Yes Sensation Testing See comments:  Yes Pulse Check Posterior Tibialis and Dorsalis pulse intact bilaterally:  Yes Comments Decreased  sensation on bilateral heels, otherwise intact to monofilament      Assessment and plan:  # 1 diabetes Control reasonable given type 1 diabetes, this is managed primarily by her endocrinologist She has mild neuropathy She is on lisinopril for her tension and renal protection.  # Hypertension Well-controlled, tolerating blood pressure of 105 well. Continue lisinopril  # Vitamin D deficiency Changing to 2000 units daily  # Anxiety Doing very well with Cymbalta and oxazepam Refilled oxazepam Discussed titrating down to one half pill once daily and then every other daily, ultimately goal of the only when necessary use.  HCM Flu shot and Prevnar given today Hep c as well  CKD 3 Discussed, recheck  labs Continue lisinopril   Orders Placed This Encounter  Procedures  . Flu Vaccine QUAD 36+ mos IM  . Bayer DCA Hb A1c Waived  . BMP8+EGFR  . Hepatitis C antibody    Meds ordered this encounter  Medications  . oxazepam (SERAX) 15 MG capsule    Sig: Take 1 capsule (15 mg total) by mouth every 8 (eight) hours as needed for anxiety.    Dispense:  60 capsule    Refill:  [place] [place], MD [facility] 03/08/2016, 10:05 AM

## 2016-03-08 NOTE — Patient Instructions (Signed)
Great to see you!  Come back in 3 months to follow up for anxiety

## 2016-03-09 LAB — BMP8+EGFR
BUN / CREAT RATIO: 30 — AB (ref 9–23)
BUN: 36 mg/dL — AB (ref 6–24)
CHLORIDE: 96 mmol/L (ref 96–106)
CO2: 24 mmol/L (ref 18–29)
Calcium: 8.6 mg/dL — ABNORMAL LOW (ref 8.7–10.2)
Creatinine, Ser: 1.19 mg/dL — ABNORMAL HIGH (ref 0.57–1.00)
GFR calc non Af Amer: 52 mL/min/{1.73_m2} — ABNORMAL LOW (ref >59)
GFR, EST AFRICAN AMERICAN: 60 mL/min/{1.73_m2} (ref >59)
GLUCOSE: 389 mg/dL — AB (ref 65–99)
POTASSIUM: 4.9 mmol/L (ref 3.5–5.2)
Sodium: 134 mmol/L (ref 134–144)

## 2016-03-09 LAB — HEPATITIS C ANTIBODY

## 2016-03-21 ENCOUNTER — Encounter: Payer: Self-pay | Admitting: *Deleted

## 2016-04-05 ENCOUNTER — Other Ambulatory Visit: Payer: Self-pay | Admitting: Family Medicine

## 2016-04-05 DIAGNOSIS — F411 Generalized anxiety disorder: Secondary | ICD-10-CM

## 2016-04-05 NOTE — Telephone Encounter (Signed)
Patient aware and states she has plenty until her follow up appointment. Patient states it was the pharmacy that called not her.

## 2016-04-13 DIAGNOSIS — E109 Type 1 diabetes mellitus without complications: Secondary | ICD-10-CM | POA: Diagnosis not present

## 2016-04-13 DIAGNOSIS — I10 Essential (primary) hypertension: Secondary | ICD-10-CM | POA: Diagnosis not present

## 2016-04-13 DIAGNOSIS — E559 Vitamin D deficiency, unspecified: Secondary | ICD-10-CM | POA: Diagnosis not present

## 2016-05-26 ENCOUNTER — Other Ambulatory Visit: Payer: Self-pay | Admitting: Family Medicine

## 2016-06-14 ENCOUNTER — Ambulatory Visit: Payer: Medicare Other | Admitting: Family Medicine

## 2016-06-20 ENCOUNTER — Encounter (INDEPENDENT_AMBULATORY_CARE_PROVIDER_SITE_OTHER): Payer: Self-pay

## 2016-06-20 ENCOUNTER — Encounter: Payer: Self-pay | Admitting: Family Medicine

## 2016-06-20 ENCOUNTER — Ambulatory Visit (INDEPENDENT_AMBULATORY_CARE_PROVIDER_SITE_OTHER): Payer: Medicare Other | Admitting: Family Medicine

## 2016-06-20 VITALS — BP 108/68 | HR 75 | Temp 97.1°F | Ht 66.0 in | Wt 188.6 lb

## 2016-06-20 DIAGNOSIS — F411 Generalized anxiety disorder: Secondary | ICD-10-CM

## 2016-06-20 DIAGNOSIS — I10 Essential (primary) hypertension: Secondary | ICD-10-CM

## 2016-06-20 DIAGNOSIS — E559 Vitamin D deficiency, unspecified: Secondary | ICD-10-CM | POA: Diagnosis not present

## 2016-06-20 MED ORDER — OXAZEPAM 15 MG PO CAPS: 15.0000 mg | ORAL_CAPSULE | Freq: Three times a day (TID) | ORAL | 2 refills | Status: DC | PRN

## 2016-06-20 NOTE — Patient Instructions (Signed)
Great to see you!  Please come back in 3 months unless you need us sooner.

## 2016-06-20 NOTE — Progress Notes (Signed)
   HPI  Patient presents today here for follow-up chronic medical conditions.  Anxiety Doing very well with Cymbalta plus oxazepam, patient is down to using about one oxazepam pill once daily. Denies any suicidal thoughts.  Type 2 diabetes is managed by endocrinology.  Vitamin D deficiency, recently started on 50,000 units weekly by endocrinology, tolerating well.  Hypertension Good medication compliance, no chest pain, dyspnea, palpitations, leg edema.   PMH: Smoking status noted ROS: Per HPI  Objective: BP 108/68   Pulse 75   Temp 97.1 F (36.2 C) (Oral)   Ht 5\' 6"  (1.676 m)   Wt 188 lb 9.6 oz (85.5 kg)   BMI 30.44 kg/m  Gen: NAD, alert, cooperative with exam HEENT: NCAT CV: RRR, good S1/S2, no murmur Resp: CTABL, no wheezes, non-labored Ext: No edema, warm Neuro: Alert and oriented, No gross deficits  Assessment and plan:  # Generalized anxiety disorder Doing well with Cymbalta plus Serax Refilled Serax Follow-up 3-4 months.  # Type 1 diabetes Managed per endocrinology, A1c being checked routinely by them  # Hypertension Well-controlled on current medications, no changes With chronic kidney disease stage III I am planning labs every 6 months.  # Vitamin D deficiency Being replaced with 50,000 units weekly Tolerating treatment easily. Suspect improvement      Meds ordered this encounter  Medications  . oxazepam (SERAX) 15 MG capsule    Sig: Take 1 capsule (15 mg total) by mouth every 8 (eight) hours as needed for anxiety.    Dispense:  60 capsule    Refill:  2    Murtis SinkSam Dontaye Hur, MD [facility] 06/20/2016, 10:08 AM

## 2016-06-27 DIAGNOSIS — E113593 Type 2 diabetes mellitus with proliferative diabetic retinopathy without macular edema, bilateral: Secondary | ICD-10-CM | POA: Diagnosis not present

## 2016-06-27 DIAGNOSIS — Z961 Presence of intraocular lens: Secondary | ICD-10-CM | POA: Diagnosis not present

## 2016-06-27 DIAGNOSIS — E1136 Type 2 diabetes mellitus with diabetic cataract: Secondary | ICD-10-CM | POA: Diagnosis not present

## 2016-06-27 DIAGNOSIS — H2511 Age-related nuclear cataract, right eye: Secondary | ICD-10-CM | POA: Diagnosis not present

## 2016-06-27 DIAGNOSIS — H5704 Mydriasis: Secondary | ICD-10-CM | POA: Diagnosis not present

## 2016-06-27 DIAGNOSIS — Z9889 Other specified postprocedural states: Secondary | ICD-10-CM | POA: Diagnosis not present

## 2016-06-27 DIAGNOSIS — Z9842 Cataract extraction status, left eye: Secondary | ICD-10-CM | POA: Diagnosis not present

## 2016-07-09 ENCOUNTER — Other Ambulatory Visit: Payer: Self-pay | Admitting: Family Medicine

## 2016-07-16 DIAGNOSIS — Z9641 Presence of insulin pump (external) (internal): Secondary | ICD-10-CM | POA: Diagnosis not present

## 2016-07-16 DIAGNOSIS — I10 Essential (primary) hypertension: Secondary | ICD-10-CM | POA: Diagnosis not present

## 2016-07-16 DIAGNOSIS — E103553 Type 1 diabetes mellitus with stable proliferative diabetic retinopathy, bilateral: Secondary | ICD-10-CM | POA: Diagnosis not present

## 2016-07-16 DIAGNOSIS — E559 Vitamin D deficiency, unspecified: Secondary | ICD-10-CM | POA: Diagnosis not present

## 2016-07-16 DIAGNOSIS — E109 Type 1 diabetes mellitus without complications: Secondary | ICD-10-CM | POA: Diagnosis not present

## 2016-09-20 ENCOUNTER — Ambulatory Visit: Payer: Medicare Other | Admitting: Family Medicine

## 2016-09-24 ENCOUNTER — Other Ambulatory Visit: Payer: Self-pay | Admitting: Family Medicine

## 2016-09-24 DIAGNOSIS — F411 Generalized anxiety disorder: Secondary | ICD-10-CM

## 2016-10-05 ENCOUNTER — Emergency Department (HOSPITAL_COMMUNITY): Payer: Medicare Other

## 2016-10-05 ENCOUNTER — Encounter (HOSPITAL_COMMUNITY): Payer: Self-pay | Admitting: Emergency Medicine

## 2016-10-05 ENCOUNTER — Emergency Department (HOSPITAL_COMMUNITY)
Admission: EM | Admit: 2016-10-05 | Discharge: 2016-10-05 | Disposition: A | Discharge status: Home / Self Care | Payer: Medicare Other | Attending: Emergency Medicine | Admitting: Emergency Medicine

## 2016-10-05 DIAGNOSIS — Z79899 Other long term (current) drug therapy: Secondary | ICD-10-CM | POA: Insufficient documentation

## 2016-10-05 DIAGNOSIS — Y999 Unspecified external cause status: Secondary | ICD-10-CM | POA: Insufficient documentation

## 2016-10-05 DIAGNOSIS — I129 Hypertensive chronic kidney disease with stage 1 through stage 4 chronic kidney disease, or unspecified chronic kidney disease: Secondary | ICD-10-CM | POA: Diagnosis not present

## 2016-10-05 DIAGNOSIS — Y92010 Kitchen of single-family (private) house as the place of occurrence of the external cause: Secondary | ICD-10-CM | POA: Diagnosis not present

## 2016-10-05 DIAGNOSIS — G4489 Other headache syndrome: Secondary | ICD-10-CM | POA: Diagnosis not present

## 2016-10-05 DIAGNOSIS — S21219A Laceration without foreign body of unspecified back wall of thorax without penetration into thoracic cavity, initial encounter: Secondary | ICD-10-CM | POA: Diagnosis not present

## 2016-10-05 DIAGNOSIS — Y9301 Activity, walking, marching and hiking: Secondary | ICD-10-CM | POA: Diagnosis not present

## 2016-10-05 DIAGNOSIS — R51 Headache: Secondary | ICD-10-CM | POA: Diagnosis not present

## 2016-10-05 DIAGNOSIS — E1122 Type 2 diabetes mellitus with diabetic chronic kidney disease: Secondary | ICD-10-CM | POA: Diagnosis not present

## 2016-10-05 DIAGNOSIS — S0101XA Laceration without foreign body of scalp, initial encounter: Secondary | ICD-10-CM | POA: Diagnosis not present

## 2016-10-05 DIAGNOSIS — R55 Syncope and collapse: Secondary | ICD-10-CM | POA: Diagnosis not present

## 2016-10-05 DIAGNOSIS — N183 Chronic kidney disease, stage 3 (moderate): Secondary | ICD-10-CM | POA: Insufficient documentation

## 2016-10-05 DIAGNOSIS — S0990XA Unspecified injury of head, initial encounter: Secondary | ICD-10-CM | POA: Diagnosis present

## 2016-10-05 DIAGNOSIS — Z794 Long term (current) use of insulin: Secondary | ICD-10-CM | POA: Diagnosis not present

## 2016-10-05 DIAGNOSIS — S199XXA Unspecified injury of neck, initial encounter: Secondary | ICD-10-CM | POA: Diagnosis not present

## 2016-10-05 DIAGNOSIS — W1809XA Striking against other object with subsequent fall, initial encounter: Secondary | ICD-10-CM | POA: Insufficient documentation

## 2016-10-05 DIAGNOSIS — R404 Transient alteration of awareness: Secondary | ICD-10-CM | POA: Diagnosis not present

## 2016-10-05 LAB — CBC WITH DIFFERENTIAL/PLATELET
Basophils Absolute: 0.1 10*3/uL (ref 0.0–0.1)
Basophils Relative: 1 %
Eosinophils Absolute: 0.2 10*3/uL (ref 0.0–0.7)
Eosinophils Relative: 2 %
HEMATOCRIT: 36.4 % (ref 36.0–46.0)
Hemoglobin: 12.4 g/dL (ref 12.0–15.0)
LYMPHS ABS: 1.4 10*3/uL (ref 0.7–4.0)
Lymphocytes Relative: 17 %
MCH: 31 pg (ref 26.0–34.0)
MCHC: 34.1 g/dL (ref 30.0–36.0)
MCV: 91 fL (ref 78.0–100.0)
MONO ABS: 0.5 10*3/uL (ref 0.1–1.0)
MONOS PCT: 7 %
NEUTROS ABS: 6.2 10*3/uL (ref 1.7–7.7)
Neutrophils Relative %: 73 %
Platelets: 236 10*3/uL (ref 150–400)
RBC: 4 MIL/uL (ref 3.87–5.11)
RDW: 12.7 % (ref 11.5–15.5)
WBC: 8.3 10*3/uL (ref 4.0–10.5)

## 2016-10-05 LAB — BASIC METABOLIC PANEL
ANION GAP: 9 (ref 5–15)
BUN: 29 mg/dL — AB (ref 6–20)
CHLORIDE: 103 mmol/L (ref 101–111)
CO2: 23 mmol/L (ref 22–32)
Calcium: 9.2 mg/dL (ref 8.9–10.3)
Creatinine, Ser: 1.22 mg/dL — ABNORMAL HIGH (ref 0.44–1.00)
GFR calc Af Amer: 58 mL/min — ABNORMAL LOW (ref >60)
GFR calc non Af Amer: 50 mL/min — ABNORMAL LOW (ref >60)
Glucose, Bld: 192 mg/dL — ABNORMAL HIGH (ref 65–99)
POTASSIUM: 4.2 mmol/L (ref 3.5–5.1)
Sodium: 135 mmol/L (ref 135–145)

## 2016-10-05 LAB — CBG MONITORING, ED
GLUCOSE-CAPILLARY: 158 mg/dL — AB (ref 65–99)
Glucose-Capillary: 287 mg/dL — ABNORMAL HIGH (ref 65–99)

## 2016-10-05 MED ORDER — ACETAMINOPHEN-CODEINE #3 300-30 MG PO TABS: 1.0000 | ORAL_TABLET | Freq: Four times a day (QID) | ORAL | 0 refills | Status: DC | PRN

## 2016-10-05 MED ORDER — DULOXETINE HCL 30 MG PO CPEP
30.0000 mg | ORAL_CAPSULE | Freq: Once | ORAL | Status: AC
  Administered 2016-10-05: 30 mg via ORAL
  Filled 2016-10-05: qty 1

## 2016-10-05 MED ORDER — ACETAMINOPHEN 500 MG PO TABS
1000.0000 mg | ORAL_TABLET | Freq: Once | ORAL | Status: AC
  Administered 2016-10-05: 1000 mg via ORAL
  Filled 2016-10-05: qty 2

## 2016-10-05 NOTE — ED Notes (Addendum)
[facility] 287, pt states she will correct with insulin at home, informed Dr Adriana Simas, EDP of [facility] and plan to manage at home, okay per Dr Adriana Simas

## 2016-10-05 NOTE — ED Provider Notes (Signed)
[facility]-EMER[facility]Y DEPT Provider Note   CSN: [id number] Arrival date & time: 10/05/16  4098  By signing my name below, I, Bing Neighbors., attest that this documentation has been prepared under the direction and in the presence of Donnetta Hutching, MD. Electronically signed: Bing Neighbors., ED Scribe. 10/05/16. 3:04 PM.   History   Chief Complaint Chief Complaint  Patient presents with  . Loss of Consciousness    HPI Carol Jensen is a 56 y.o. female with hx of diabetes mellitus (x45 years) who presents to the Emergency Department complaining of LOC with onset x1 hour. Pt states that she awoke this morning with bilateral calf cramping.She reports a hx of calf cramping when her blood sugar is low. Pt states that she tried to walk to the kitchen and became dizzy and lightheaded prior to losing consciousness. Upon LOC, pt reportedly hit her head on ceramic ducks causing a 1cm laceration to the occiput. Pt reports neck pain. She denies any modifying factors. Pt denies any other complaints. Of note, pt is followed by Dr. Morrison Old in [place] for diabetes. Pt's husband states that she is acting normal.    The history is provided by the patient and the spouse. No language interpreter was used.    Past Medical History:  Diagnosis Date  . Anxiety   . Arthritis   . Cataract    right eye  . Chronic kidney disease   . Diabetes (HCC) 09/08/2013  . Diabetes mellitus   . Diabetic retinopathy (HCC)   . GERD (gastroesophageal reflux disease)   . Gum disease   . Hypertension   . Mental disorder    anxiety  . Peri-menopause 09/08/2013  . PONV (postoperative nausea and vomiting)   . Unspecified symptom associated with female genital organs 09/08/2013    Patient Active Problem List   Diagnosis Date Noted  . 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 Vitamin D deficiency 03/08/2016  . Healthcare maintenance 08/29/2015  . HTN (hypertension) 08/01/2015  . GAD  (generalized anxiety disorder) 08/01/2015  . DM type 1 (diabetes mellitus, type 1) (HCC) 10/02/2013  . Endometrial polyp 09/23/2013  . Hot flashes 09/08/2013  . Diabetes (HCC) 09/08/2013  . Peri-menopause 09/08/2013  . Unspecified symptom associated with female genital organs 09/08/2013  . Proliferative diabetic retinopathy associated with type 2 diabetes mellitus (HCC) 05/10/2013  . Acid reflux 03/27/2012  . Nuclear sclerotic cataract 05/07/2011    Past Surgical History:  Procedure Laterality Date  . EYE SURGERY     detatchen retina-hemorrhages behind eyes-[facility]- 6 times  . HAND SURGERY Left    trigger finger  . HYSTEROSCOPY W/D&C N/A 10/20/2013   Procedure: DILATATION AND CURETTAGE /HYSTEROSCOPY;  Surgeon: Tilda Burrow, MD;  Location: [facility];  Service: Gynecology;  Laterality: N/A;  . POLYPECTOMY N/A 10/20/2013   Procedure: POLYPECTOMY;  Surgeon: Tilda Burrow, MD;  Location: [facility];  Service: Gynecology;  Laterality: N/A;  . right shoulder   2011, 2012  . SHOULDER SURGERY Left 2002    OB History    No data available       Home Medications    Prior to Admission medications   Medication Sig Start Date End Date Taking? Authorizing Provider  clotrimazole-betamethasone (LOTRISONE) cream Apply topically 2 (two) times daily. 01/05/15  Yes Tilda Burrow, MD  DULoxetine (CYMBALTA) 30 MG capsule Take 1 Capsule by mouth once daily 05/28/16  Yes Elenora Gamma, MD  hydrochlorothiazide (  HYDRODIURIL) 25 MG tablet Take 25 mg by mouth daily.   Yes Historical Provider, MD  insulin aspart (NOVOLOG) 100 UNIT/ML injection Use approximately 80 units daily via Insulin Pump  DX E10.9 and Z96.41 10/25/15 10/24/16 Yes Historical Provider, MD  lisinopril (PRINIVIL,ZESTRIL) 10 MG tablet Take 1 tablet (10 mg total) by mouth daily. 12/02/15  Yes Elenora Gamma, MD  oxazepam (SERAX) 15 MG capsule Take 1 capsule (15 mg total) by mouth every 8 (eight) hours as needed for anxiety. 06/20/16  Yes Elenora Gamma, MD  ranitidine (ZANTAC) 150 MG tablet Take 1 Tablet by mouth 2 times a day 07/10/16  Yes Elenora Gamma, MD  Vitamin D, Ergocalciferol, (DRISDOL) 50000 units CAPS capsule Take 50,000 Units by mouth every 7 (seven) days.  07/28/15  Yes Historical Provider, MD  acetaminophen-codeine (TYLENOL #3) 300-30 MG tablet Take 1-2 tablets by mouth every 6 (six) hours as needed for moderate pain. 10/05/16   Donnetta Hutching, MD    Family History Family History  Problem Relation Age of Onset  . Asthma Mother   . Hyperlipidemia Mother   . Heart attack Father   . Heart disease Father   . Asthma Brother   . Cancer Maternal Aunt     pancreatic  . Diabetes Maternal Grandmother   . Hypertension Maternal Uncle   . Heart disease Maternal Uncle   . Diabetes Paternal Aunt   . Hypertension Paternal Aunt   . Diabetes Paternal Uncle   . Hypertension Paternal Uncle     Social History Social History  Substance Use Topics  . Smoking status: Never Smoker  . Smokeless tobacco: Never Used  . Alcohol use No     Allergies   Aspirin   Review of Systems Review of Systems  All other systems reviewed and are negative.    Physical Exam Updated Vital Signs BP (!) 120/56   Pulse 95   Temp 98.3 F (36.8 C) (Oral)   Resp (!) 24   Ht  (1.676 m)   Wt 184 lb (83.5 kg)   LMP 03/24/2013   SpO2 99%   BMI 29.70 kg/m   Physical Exam  Constitutional: She is oriented to person, place, and time. She appears well-developed and well-nourished.  HENT:  Head: Normocephalic and atraumatic.  Eyes: Conjunctivae are normal.  Neck: Neck supple.  Minimal psoterior neck tend  Cardiovascular: Normal rate and regular rhythm.   Pulmonary/Chest: Effort normal and breath sounds normal.  Abdominal: Soft. Bowel sounds are normal.  Musculoskeletal: Normal range of motion.  Neurological: She is alert and oriented to person, place, and time.  Skin: Skin is warm and dry. Laceration noted.  1 cm laceration on her  mid occipital area;  2 areas of lacerations each approximately 0.75 cm in length on the upper back  Psychiatric: She has a normal mood and affect. Her behavior is normal.  Nursing note and vitals reviewed.    ED Treatments / Results   DIAGNOSTIC STUDIES: Oxygen Saturation is 100% on RA, normal by my interpretation.   COORDINATION OF CARE: 3:04 PM-Discussed next steps with pt. Pt verbalized understanding and is agreeable with the plan.    Labs (all labs ordered are listed, but only abnormal results are displayed) Labs Reviewed  BASIC METABOLIC PANEL - Abnormal; Notable for the following:       Result Value   Glucose, Bld 192 (*)    BUN 29 (*)    Creatinine, Ser 1.22 (*)  GFR calc non Af Amer 50 (*)    GFR calc Af Amer 58 (*)    All other components within normal limits  CBG MONITORING, ED - Abnormal; Notable for the following:    Glucose-Capillary 158 (*)    All other components within normal limits  CBC WITH DIFFERENTIAL/PLATELET    EKG  EKG Interpretation  Date/Time:  Friday October 05 2016 10:01:31 EDT Ventricular Rate:  69 PR Interval:    QRS Duration: 82 QT Interval:  378 QTC Calculation: 405 R Axis:   93 Text Interpretation:  Sinus rhythm Borderline right axis deviation Low voltage, precordial leads Confirmed by Kyran Connaughton  MD, Karlie Aung ([id number]) on 10/05/2016 11:20:33 AM       Radiology Ct Head Wo Contrast  Result Date: 10/05/2016 CLINICAL DATA:  Syncope, fall, posterior head injury, headache EXAM: CT HEAD WITHOUT CONTRAST CT CERVICAL SPINE WITHOUT CONTRAST TECHNIQUE: Multidetector CT imaging of the head and cervical spine was performed following the standard protocol without intravenous contrast. Multiplanar CT image reconstructions of the cervical spine were also generated. COMPARISON:  None. FINDINGS: CT HEAD FINDINGS Brain: No evidence of acute infarction, hemorrhage, hydrocephalus, extra-axial collection or mass lesion/mass effect. Vascular: No hyperdense vessel or  unexpected calcification. Skull: No fracture is seen. Exostosis along the left frontal bone (series 4/ image 47), benign. Sinuses/Orbits: The visualized paranasal sinuses are essentially clear. The mastoid air cells are unopacified. Other: None. CT CERVICAL SPINE FINDINGS Alignment: Normal. Skull base and vertebrae: No acute fracture. No primary bone lesion or focal pathologic process. Soft tissues and spinal canal: No prevertebral fluid or swelling. No visible canal hematoma. Disc levels:  Mild degenerative changes at C5-6. Spinal canal is patent. Upper chest: Visualized lung apices are clear. Other: Visualized thyroid is unremarkable. IMPRESSION: Normal head CT. No evidence of traumatic injury to the cervical spine. Electronically Signed   By: Charline Bills M.D.   On: 10/05/2016 13:06   Ct Cervical Spine Wo Contrast  Result Date: 10/05/2016 CLINICAL DATA:  Syncope, fall, posterior head injury, headache EXAM: CT HEAD WITHOUT CONTRAST CT CERVICAL SPINE WITHOUT CONTRAST TECHNIQUE: Multidetector CT imaging of the head and cervical spine was performed following the standard protocol without intravenous contrast. Multiplanar CT image reconstructions of the cervical spine were also generated. COMPARISON:  None. FINDINGS: CT HEAD FINDINGS Brain: No evidence of acute infarction, hemorrhage, hydrocephalus, extra-axial collection or mass lesion/mass effect. Vascular: No hyperdense vessel or unexpected calcification. Skull: No fracture is seen. Exostosis along the left frontal bone (series 4/ image 47), benign. Sinuses/Orbits: The visualized paranasal sinuses are essentially clear. The mastoid air cells are unopacified. Other: None. CT CERVICAL SPINE FINDINGS Alignment: Normal. Skull base and vertebrae: No acute fracture. No primary bone lesion or focal pathologic process. Soft tissues and spinal canal: No prevertebral fluid or swelling. No visible canal hematoma. Disc levels:  Mild degenerative changes at C5-6.  Spinal canal is patent. Upper chest: Visualized lung apices are clear. Other: Visualized thyroid is unremarkable. IMPRESSION: Normal head CT. No evidence of traumatic injury to the cervical spine. Electronically Signed   By: Charline Bills M.D.   On: 10/05/2016 13:06    Procedures Procedures (including critical care time)  Medications Ordered in ED Medications  DULoxetine (CYMBALTA) DR capsule 30 mg (30 mg Oral Given 10/05/16 1113)  acetaminophen (TYLENOL) tablet 1,000 mg (1,000 mg Oral Given 10/05/16 1352)     Initial Impression / Assessment and Plan / ED Course  I have reviewed the triage vital signs and the nursing  notes.  The plan is to check basic labs on patient.  Pertinent labs & imaging results that were available during my care of the patient were reviewed by me and considered in my medical decision making (see chart for details).     Patient is hemodynamically stable. She is alert without neurological deficits. CT head and CT cervical spine negative. EKG normal. Screening labs acceptable. I rechecked the patient several times. She has remained stable. No suturing is required on her lacerations. Discharge medications Tylenol #3  Final Clinical Impressions(s) / ED Diagnoses   Final diagnoses:  Syncope, unspecified syncope type    New Prescriptions New Prescriptions   ACETAMINOPHEN-CODEINE (TYLENOL #3) 300-30 MG TABLET    Take 1-2 tablets by mouth every 6 (six) hours as needed for moderate pain.   I personally performed the services described in this documentation, which was scribed in my presence. The recorded information has been reviewed and is accurate.       Donnetta Hutching, MD 10/05/16 [phone]

## 2016-10-05 NOTE — ED Notes (Signed)
Escorted pt to BR at this time, pt able to ambulate to BR without assistance

## 2016-10-05 NOTE — Discharge Instructions (Signed)
X-rays of head and neck were normal. You will be sore for several days. You can take a shower when you get home. Apply antibiotic ointment and Band-Aids to lacerations on your upper back. Prescription for pain medication.

## 2016-10-05 NOTE — ED Notes (Signed)
1cm laceration to occipital area of head, bleeding controlled. Wound cleansed with wound cleanser.

## 2016-10-05 NOTE — ED Triage Notes (Signed)
Patient arrives via EMS from home with c/o syncope. Patient states she had woken up and was walking to the kitchen when she started having leg cramps and dizziness. Patient then lost consciousness and hit the back of her head on the floor. She only complains of headache where she hit the floor. Alert/oriented x 4. CBG 122 with EMS.

## 2016-10-05 NOTE — ED Notes (Signed)
Patient stated "I need my blood sugar checked.  It is either low or I am having a panic attack. BGL checked.

## 2016-10-09 ENCOUNTER — Ambulatory Visit (INDEPENDENT_AMBULATORY_CARE_PROVIDER_SITE_OTHER): Payer: Medicare Other | Admitting: Family Medicine

## 2016-10-09 ENCOUNTER — Encounter: Payer: Self-pay | Admitting: Family Medicine

## 2016-10-09 VITALS — BP 106/66 | HR 82 | Temp 98.6°F | Ht 66.0 in | Wt 186.8 lb

## 2016-10-09 DIAGNOSIS — R55 Syncope and collapse: Secondary | ICD-10-CM

## 2016-10-09 DIAGNOSIS — I10 Essential (primary) hypertension: Secondary | ICD-10-CM | POA: Diagnosis not present

## 2016-10-09 DIAGNOSIS — F411 Generalized anxiety disorder: Secondary | ICD-10-CM

## 2016-10-09 MED ORDER — OXAZEPAM 15 MG PO CAPS: 15.0000 mg | ORAL_CAPSULE | Freq: Three times a day (TID) | ORAL | 2 refills | Status: DC | PRN

## 2016-10-09 MED ORDER — DULOXETINE HCL 30 MG PO CPEP: 30.0000 mg | ORAL_CAPSULE | Freq: Every day | ORAL | 3 refills | Status: DC

## 2016-10-09 MED ORDER — ACETAMINOPHEN-CODEINE #3 300-30 MG PO TABS: 1.0000 | ORAL_TABLET | Freq: Four times a day (QID) | ORAL | 0 refills | Status: DC | PRN

## 2016-10-09 NOTE — Progress Notes (Signed)
   HPI  Patient presents today here follow-up for chronic medical conditions and hospital follow-up.  Patient had a syncopal episode presented to be due to hypoglycemia. She was worked up in the emergency room using CT scans and x-rays. She had a small amount of Tylenol codeine given for pain. She requests refill in case she needs more.  Anxiety Doing very well with Cymbalta and oxazepam, needs refill.  Hypertension Doing well with HCTZ and lisinopril, good medication compliance. No dizziness. Patient has follow-up with endocrinology in about one week.  PMH: Smoking status noted ROS: Per HPI  Objective: BP 106/66   Pulse 82   Temp 98.6 F (37 C) (Oral)   Ht  (1.676 m)   Wt 186 lb 12.8 oz (84.7 kg)   LMP 03/24/2013   BMI 30.15 kg/m  Gen: NAD, alert, cooperative with exam HEENT: Healing lesion on the posterior occipital skull CV: RRR, good S1/S2, no murmur Resp: CTABL, no wheezes, non-labored Ext: No edema, warm Neuro: Alert and oriented, No gross deficits  Assessment and plan:  # Syncope Resolved, no other episodes, most likely due to hypoglycemia. Patient has follow-up with endocrinology in about one week. Lesion of the posterior occipital and posterior back healing well. Tylenol 3 refill 20 pills  # Anxiety Doing well with Cymbalta plus oxazepam. Needs refills, these were given.  # Hypertension Well-controlled with HCTZ plus lisinopril, no changes.   Meds ordered this encounter  Medications  . acetaminophen-codeine (TYLENOL #3) 300-30 MG tablet    Sig: Take 1-2 tablets by mouth every 6 (six) hours as needed for moderate pain.    Dispense:  20 tablet    Refill:  0  . oxazepam (SERAX) 15 MG capsule    Sig: Take 1 capsule (15 mg total) by mouth every 8 (eight) hours as needed for anxiety.    Dispense:  60 capsule    Refill:  2  . DULoxetine (CYMBALTA) 30 MG capsule    Sig: Take 1 capsule (30 mg total) by mouth daily.    Dispense:  90 capsule   Refill:  3    This prescription was filled on 05/26/2016. Any refills authorized will be placed on file.    Murtis Sink, MD [facility] 10/09/2016, 10:05 AM

## 2016-10-09 NOTE — Patient Instructions (Signed)
Great to see you!  Come back in 3 months unless you need us sooner.    

## 2016-10-12 ENCOUNTER — Other Ambulatory Visit: Payer: Self-pay | Admitting: Family Medicine

## 2016-10-12 DIAGNOSIS — F411 Generalized anxiety disorder: Secondary | ICD-10-CM

## 2016-10-15 DIAGNOSIS — E103553 Type 1 diabetes mellitus with stable proliferative diabetic retinopathy, bilateral: Secondary | ICD-10-CM | POA: Diagnosis not present

## 2016-10-15 DIAGNOSIS — E559 Vitamin D deficiency, unspecified: Secondary | ICD-10-CM | POA: Diagnosis not present

## 2016-10-15 DIAGNOSIS — Z9641 Presence of insulin pump (external) (internal): Secondary | ICD-10-CM | POA: Diagnosis not present

## 2016-10-15 DIAGNOSIS — E109 Type 1 diabetes mellitus without complications: Secondary | ICD-10-CM | POA: Diagnosis not present

## 2016-10-15 DIAGNOSIS — I10 Essential (primary) hypertension: Secondary | ICD-10-CM | POA: Diagnosis not present

## 2016-10-16 ENCOUNTER — Other Ambulatory Visit: Payer: Self-pay | Admitting: Family Medicine

## 2016-10-16 DIAGNOSIS — F411 Generalized anxiety disorder: Secondary | ICD-10-CM

## 2016-11-08 ENCOUNTER — Other Ambulatory Visit: Payer: Self-pay | Admitting: Physician Assistant

## 2016-11-08 DIAGNOSIS — F411 Generalized anxiety disorder: Secondary | ICD-10-CM

## 2017-01-14 DIAGNOSIS — E103553 Type 1 diabetes mellitus with stable proliferative diabetic retinopathy, bilateral: Secondary | ICD-10-CM | POA: Diagnosis not present

## 2017-01-14 DIAGNOSIS — I10 Essential (primary) hypertension: Secondary | ICD-10-CM | POA: Diagnosis not present

## 2017-01-14 DIAGNOSIS — E559 Vitamin D deficiency, unspecified: Secondary | ICD-10-CM | POA: Diagnosis not present

## 2017-01-15 ENCOUNTER — Encounter: Payer: Self-pay | Admitting: Family Medicine

## 2017-01-15 ENCOUNTER — Ambulatory Visit (INDEPENDENT_AMBULATORY_CARE_PROVIDER_SITE_OTHER): Payer: Medicare Other | Admitting: Family Medicine

## 2017-01-15 VITALS — BP 107/67 | HR 71 | Temp 97.5°F | Ht 66.0 in | Wt 187.2 lb

## 2017-01-15 DIAGNOSIS — K219 Gastro-esophageal reflux disease without esophagitis: Secondary | ICD-10-CM | POA: Diagnosis not present

## 2017-01-15 DIAGNOSIS — Z0000 Encounter for general adult medical examination without abnormal findings: Secondary | ICD-10-CM

## 2017-01-15 DIAGNOSIS — F411 Generalized anxiety disorder: Secondary | ICD-10-CM

## 2017-01-15 MED ORDER — OXAZEPAM 15 MG PO CAPS: ORAL_CAPSULE | ORAL | 2 refills | Status: DC

## 2017-01-15 MED ORDER — RANITIDINE HCL 150 MG PO TABS: 150.0000 mg | ORAL_TABLET | Freq: Two times a day (BID) | ORAL | 3 refills | Status: DC

## 2017-01-15 NOTE — Patient Instructions (Signed)
Great to see you!  Come back in 3 months unless you need us sooner.    

## 2017-01-15 NOTE — Progress Notes (Signed)
   HPI  Patient presents today here to follow-up for anxiety.  He is doing well with Cymbalta plus oxazepam, she is down to one pill of oxazepam once daily, she occasionally needs an extra pill.  GERD Well-controlled on H2 blocker, needs refill.  Mammogram discussed, patient has not had one since 2016, she plans to get a Pap smear and mammogram in the fall with her gynecologist, Dr. Emelda FearFerguson.  PMH: Smoking status noted ROS: Per HPI  Objective: BP 107/67   Pulse 71   Temp (!) 97.5 F (36.4 C) (Oral)   Ht 5\' 6"  (1.676 m)   Wt 187 lb 3.2 oz (84.9 kg)   LMP 03/24/2013   BMI 30.21 kg/m  Gen: NAD, alert, cooperative with exam HEENT: NCAT CV: RRR, good S1/S2, no murmur Resp: CTABL, no wheezes, non-labored Ext: No edema, warm Neuro: Alert and oriented, No gross deficits  Labs reviewed from 01/14/2017 on Care-everywhere  Assessment and plan:  # Generalized anxiety disorder Stable, improving Continue Cymbalta, reduced number of oxazepam pills per month, 40 pills per month.   # GERD Well-controlled on H2 blocker, refilled. Previously on chronic PPI  # Healthcare maintenance Discussed mammogram Patient does have a plan in place to get her mammogram and Pap smear, she will follow-up with GYN in the fall  Meds ordered this encounter  Medications  . oxazepam (SERAX) 15 MG capsule    Sig: Take 1 Capsule by mouth every 8 hours as needed FOR ANXIETY    Dispense:  40 capsule    Refill:  2  . ranitidine (ZANTAC) 150 MG tablet    Sig: Take 1 tablet (150 mg total) by mouth 2 (two) times daily.    Dispense:  180 tablet    Refill:  3    This prescription was filled on 07/09/2016. Any refills authorized will be placed on file.    Murtis SinkSam Stamatia Masri, MD [facility] 01/15/2017, 10:03 AM

## 2017-02-04 ENCOUNTER — Other Ambulatory Visit: Payer: Self-pay | Admitting: Family Medicine

## 2017-02-04 DIAGNOSIS — F411 Generalized anxiety disorder: Secondary | ICD-10-CM

## 2017-02-07 NOTE — Telephone Encounter (Signed)
Will ask nursing to contact pt to see if she is doing ok.   Refilled less than 1 month ago for 3 months.   Murtis Sink, MD [facility] 02/07/2017, 9:39 AM

## 2017-02-07 NOTE — Telephone Encounter (Signed)
Pt has the printed rx and hasn't taken it to the pharmacy because she hasn't needed it. They had it on automatic renewal.

## 2017-02-08 ENCOUNTER — Other Ambulatory Visit: Payer: Self-pay | Admitting: Family Medicine

## 2017-02-08 DIAGNOSIS — F411 Generalized anxiety disorder: Secondary | ICD-10-CM

## 2017-02-11 NOTE — Telephone Encounter (Signed)
Last seen 01/15/17  Dr Ermalinda Memos  If approved route to nurse to call into  [facility]

## 2017-03-13 ENCOUNTER — Other Ambulatory Visit: Payer: Self-pay | Admitting: Obstetrics and Gynecology

## 2017-03-13 ENCOUNTER — Other Ambulatory Visit: Payer: Self-pay | Admitting: Adult Health

## 2017-03-13 DIAGNOSIS — Z1231 Encounter for screening mammogram for malignant neoplasm of breast: Secondary | ICD-10-CM

## 2017-03-20 ENCOUNTER — Ambulatory Visit (INDEPENDENT_AMBULATORY_CARE_PROVIDER_SITE_OTHER): Payer: Medicare Other

## 2017-03-20 DIAGNOSIS — Z23 Encounter for immunization: Secondary | ICD-10-CM

## 2017-04-16 DIAGNOSIS — I10 Essential (primary) hypertension: Secondary | ICD-10-CM | POA: Diagnosis not present

## 2017-04-16 DIAGNOSIS — E103553 Type 1 diabetes mellitus with stable proliferative diabetic retinopathy, bilateral: Secondary | ICD-10-CM | POA: Diagnosis not present

## 2017-04-16 DIAGNOSIS — E103599 Type 1 diabetes mellitus with proliferative diabetic retinopathy without macular edema, unspecified eye: Secondary | ICD-10-CM | POA: Diagnosis not present

## 2017-04-22 ENCOUNTER — Encounter: Payer: Self-pay | Admitting: Family Medicine

## 2017-04-22 ENCOUNTER — Ambulatory Visit (INDEPENDENT_AMBULATORY_CARE_PROVIDER_SITE_OTHER): Payer: Medicare Other | Admitting: Family Medicine

## 2017-04-22 VITALS — BP 108/63 | HR 78 | Temp 97.3°F | Ht 66.0 in | Wt 193.4 lb

## 2017-04-22 DIAGNOSIS — F411 Generalized anxiety disorder: Secondary | ICD-10-CM

## 2017-04-22 DIAGNOSIS — S99921A Unspecified injury of right foot, initial encounter: Secondary | ICD-10-CM | POA: Diagnosis not present

## 2017-04-22 MED ORDER — OXAZEPAM 15 MG PO CAPS: ORAL_CAPSULE | ORAL | 2 refills | Status: DC

## 2017-04-22 NOTE — Patient Instructions (Signed)
Great to see you!  If your toe does not heal as expected please let me know, I am glad to help you with an x ray.

## 2017-04-22 NOTE — Progress Notes (Signed)
   HPI  Patient presents today here with a toe injury and follow-up for anxiety.  Toe injury Patient states that she rolled her right great toe under her foot while working on a area rug last Saturday.  This was 2 days ago. She states that she has no problems bearing weight she does not believe that its broken.  It has been itching.  Anxiety Doing very well, needs refill of oxazepam.  PMH: Smoking status noted ROS: Per HPI  Objective: BP 108/63   Pulse 78   Temp (!) 97.3 F (36.3 C) (Oral)   Ht 5\' 6"  (1.676 m)   Wt 193 lb 6.4 oz (87.7 kg)   LMP 03/24/2013   BMI 31.22 kg/m  Gen: NAD, alert, cooperative with exam HEENT: NCAT CV: RRR, good S1/S2, no murmur Resp: CTABL, no wheezes, non-labored Ext: No edema, warm Neuro: Alert and oriented, No gross deficits Right great toe with bruising around the base of the nail, small amount of bruising under the nail, no pain with axial loading, flexion, extension, or abduction or adduction  Assessment and plan:  #Generalized anxiety disorder Doing well on Cymbalta plus oxazepam, refill oxazepam [facility] which has no red flags.   #Right great toe injury Consistent with severe contusion, specific signs of fracture. Patient will call back or return if not improving as expected. Discussed supportive care     Meds ordered this encounter  Medications  . oxazepam (SERAX) 15 MG capsule    Sig: Take 1 Capsule by mouth every 8 hours as needed FOR ANXIETY    Dispense:  40 capsule    Refill:  2    Murtis SinkSam Channel Papandrea, MD [facility] 04/22/2017, 10:15 AM

## 2017-04-29 ENCOUNTER — Ambulatory Visit (HOSPITAL_COMMUNITY): Payer: Medicare Other

## 2017-05-01 ENCOUNTER — Ambulatory Visit (HOSPITAL_COMMUNITY)
Admission: RE | Admit: 2017-05-01 | Discharge: 2017-05-01 | Disposition: A | Discharge status: Home / Self Care | Payer: Medicare Other | Source: Ambulatory Visit | Attending: Adult Health | Admitting: Adult Health

## 2017-05-01 DIAGNOSIS — Z1231 Encounter for screening mammogram for malignant neoplasm of breast: Secondary | ICD-10-CM | POA: Insufficient documentation

## 2017-05-13 ENCOUNTER — Other Ambulatory Visit (HOSPITAL_COMMUNITY)
Admission: RE | Admit: 2017-05-13 | Discharge: 2017-05-13 | Disposition: A | Discharge status: Home / Self Care | Payer: Medicare Other | Source: Ambulatory Visit | Attending: Adult Health | Admitting: Adult Health

## 2017-05-13 ENCOUNTER — Ambulatory Visit (INDEPENDENT_AMBULATORY_CARE_PROVIDER_SITE_OTHER): Payer: Medicare Other | Admitting: Adult Health

## 2017-05-13 ENCOUNTER — Encounter: Payer: Self-pay | Admitting: Adult Health

## 2017-05-13 VITALS — BP 110/64 | HR 96 | Ht 64.0 in | Wt 193.0 lb

## 2017-05-13 DIAGNOSIS — Z01419 Encounter for gynecological examination (general) (routine) without abnormal findings: Secondary | ICD-10-CM

## 2017-05-13 DIAGNOSIS — Z124 Encounter for screening for malignant neoplasm of cervix: Secondary | ICD-10-CM

## 2017-05-13 DIAGNOSIS — Z1211 Encounter for screening for malignant neoplasm of colon: Secondary | ICD-10-CM | POA: Diagnosis not present

## 2017-05-13 DIAGNOSIS — Z1212 Encounter for screening for malignant neoplasm of rectum: Secondary | ICD-10-CM

## 2017-05-13 LAB — HEMOCCULT GUIAC POC 1CARD (OFFICE): FECAL OCCULT BLD: NEGATIVE

## 2017-05-13 MED ORDER — CLOTRIMAZOLE-BETAMETHASONE 1-0.05 % EX CREA: TOPICAL_CREAM | Freq: Two times a day (BID) | CUTANEOUS | 99 refills | Status: DC

## 2017-05-13 NOTE — Patient Instructions (Signed)
Pap and physical in 2 years

## 2017-05-13 NOTE — Progress Notes (Signed)
[name], female   DOB: Mar 14, 1961, 56 y.o.   MRN: [id number] History of Present Illness:  Carol Jensen is a 56 year old white female, in for well woman gyn exam and pap. PCP is Dr Ermalinda MemosBradshaw. Dr Morrison OldLambeth manages her insulin pump.   Current Medications, Allergies, Past Medical History, Past Surgical History, Family History and Social History were reviewed in [facility] electronic medical record.     Review of Systems:  Patient denies any headaches, hearing loss, fatigue, blurred vision, shortness of breath, chest pain, abdominal pain, problems with bowel movements, urination, or intercourse(not having sex). No joint pain or mood swings.   Physical Exam:BP 110/64 (BP Location: Left Arm, Patient Position: Sitting, Cuff Size: Normal)   Pulse 96   Ht 5\' 4"  (1.626 m)   Wt 193 lb (87.5 kg)   LMP 03/24/2013   BMI 33.13 kg/m  General:  Well developed, well nourished, no acute distress Skin:  Warm and dry Neck:  Midline trachea, normal thyroid, good ROM, no lymphadenopathy Lungs; Clear to auscultation bilaterally Breast:  No dominant palpable mass, retraction, or nipple discharge Cardiovascular: Regular rate and rhythm Abdomen:  Soft, non tender, no hepatosplenomegaly Pelvic:  External genitalia is normal in appearance, no lesions.  The vagina is normal in appearance. Urethra has no lesions or masses. The cervix is smooth, pap with HPV performed .  Uterus is felt to be normal size, shape, and contour.  No adnexal masses or tenderness noted.Bladder is non tender, no masses felt. Rectal: Good sphincter tone, no polyps, or hemorrhoids felt.  Hemoccult negative. Extremities/musculoskeletal:  No swelling or varicosities noted, no clubbing or cyanosis Psych:  No mood changes, alert and cooperative,seems happy PHQ 2 score 0.  Impression: 1. Well woman exam with routine gynecological exam   2. Screening for colorectal cancer       Plan: Rx Lotrison use prn Physical and pap in 2  years Labs PCP  Mammogram yearly Colonoscopy advised

## 2017-05-15 LAB — CYTOLOGY - PAP
Diagnosis: NEGATIVE
HPV (WINDOPATH): NOT DETECTED

## 2017-07-17 DIAGNOSIS — E103553 Type 1 diabetes mellitus with stable proliferative diabetic retinopathy, bilateral: Secondary | ICD-10-CM | POA: Diagnosis not present

## 2017-07-17 DIAGNOSIS — E559 Vitamin D deficiency, unspecified: Secondary | ICD-10-CM | POA: Diagnosis not present

## 2017-07-17 DIAGNOSIS — I10 Essential (primary) hypertension: Secondary | ICD-10-CM | POA: Diagnosis not present

## 2017-07-23 ENCOUNTER — Ambulatory Visit (INDEPENDENT_AMBULATORY_CARE_PROVIDER_SITE_OTHER): Payer: Medicare Other | Admitting: Family Medicine

## 2017-07-23 ENCOUNTER — Encounter: Payer: Self-pay | Admitting: Family Medicine

## 2017-07-23 VITALS — BP 108/66 | HR 79 | Temp 97.6°F | Ht 64.0 in | Wt 196.8 lb

## 2017-07-23 DIAGNOSIS — I10 Essential (primary) hypertension: Secondary | ICD-10-CM

## 2017-07-23 DIAGNOSIS — E113599 Type 2 diabetes mellitus with proliferative diabetic retinopathy without macular edema, unspecified eye: Secondary | ICD-10-CM

## 2017-07-23 DIAGNOSIS — F411 Generalized anxiety disorder: Secondary | ICD-10-CM | POA: Diagnosis not present

## 2017-07-23 MED ORDER — OXAZEPAM 15 MG PO CAPS: ORAL_CAPSULE | ORAL | 2 refills | Status: DC

## 2017-07-23 MED ORDER — ACETAMINOPHEN-CODEINE #3 300-30 MG PO TABS: ORAL_TABLET | ORAL | 0 refills | Status: DC

## 2017-07-23 NOTE — Progress Notes (Signed)
   HPI  Patient presents today here to follow-up for chronic medical conditions  Anxiety Well-controlled with oxazepam times once daily plus Cymbalta.  Patient previously was not treated with Cymbalta and has had good improvement with this in general.  Hypertension Vacation compliance, very well controlled No side effects.  Diabetic retinopathy-patient has follow-up with ophthalmology coming up in March.,  [facility]  PMH: Smoking status noted ROS: Per HPI  Objective: BP 108/66   Pulse 79   Temp 97.6 F (36.4 C) (Oral)   Ht 5\' 4"  (1.626 m)   Wt 196 lb 12.8 oz (89.3 kg)   LMP 03/24/2013   BMI 33.78 kg/m  Gen: NAD, alert, cooperative with exam HEENT: NCAT CV: RRR, good S1/S2, no murmur Resp: CTABL, no wheezes, non-labored Ext: No edema, warm Neuro: Alert and oriented, No gross deficits    from 07/16/2017 at endocrinology Total cholesterol 147, HDL 90, LDL 42 A1c 7.8 Creatinine 1.611.19, GFR 52 AST 20, ALT 23   Assessment and plan:  #Hypertension Well-controlled on lisinopril Labs up-to-date, no changes  #Anxiety Doing well with Cymbalta plus oxazepam, refilled oxazepam  #Diabetic retinopathy Patient receiving very good [facility], denies any recent changes    Meds ordered this encounter  Medications  . acetaminophen-codeine (TYLENOL #3) 300-30 MG tablet    Sig: every 4 (four) hours as needed.    Dispense:  30 tablet    Refill:  0  . oxazepam (SERAX) 15 MG capsule    Sig: Take 1 Capsule by mouth every 8 hours as needed FOR ANXIETY    Dispense:  40 capsule    Refill:  2    Murtis SinkSam Bradshaw, MD [facility] 07/23/2017, 11:46 AM

## 2017-08-12 ENCOUNTER — Ambulatory Visit (INDEPENDENT_AMBULATORY_CARE_PROVIDER_SITE_OTHER): Payer: Medicare Other

## 2017-08-12 ENCOUNTER — Ambulatory Visit (INDEPENDENT_AMBULATORY_CARE_PROVIDER_SITE_OTHER): Payer: Medicare Other | Admitting: Family Medicine

## 2017-08-12 ENCOUNTER — Encounter: Payer: Self-pay | Admitting: Family Medicine

## 2017-08-12 VITALS — BP 117/69 | HR 88 | Temp 97.0°F | Ht 64.0 in | Wt 200.2 lb

## 2017-08-12 DIAGNOSIS — J029 Acute pharyngitis, unspecified: Secondary | ICD-10-CM

## 2017-08-12 DIAGNOSIS — R05 Cough: Secondary | ICD-10-CM

## 2017-08-12 DIAGNOSIS — R059 Cough, unspecified: Secondary | ICD-10-CM

## 2017-08-12 LAB — VERITOR FLU A/B WAIVED
INFLUENZA A: NEGATIVE
Influenza B: NEGATIVE

## 2017-08-12 LAB — CULTURE, GROUP A STREP

## 2017-08-12 LAB — RAPID STREP SCREEN (MED CTR MEBANE ONLY): STREP GP A AG, IA W/REFLEX: NEGATIVE

## 2017-08-12 MED ORDER — FLUTICASONE PROPIONATE 50 MCG/ACT NA SUSP: 2.0000 | Freq: Every day | NASAL | 0 refills | Status: DC

## 2017-08-12 MED ORDER — BENZONATATE 100 MG PO CAPS: 100.0000 mg | ORAL_CAPSULE | Freq: Three times a day (TID) | ORAL | 0 refills | Status: DC | PRN

## 2017-08-12 MED ORDER — METHYLPREDNISOLONE ACETATE 80 MG/ML IJ SUSP
80.0000 mg | Freq: Once | INTRAMUSCULAR | Status: AC
  Administered 2017-08-12: 80 mg via INTRAMUSCULAR

## 2017-08-12 NOTE — Progress Notes (Signed)
   HPI  Patient presents today here with illness.  Patient has 2 days history of cough, sore throat, bilateral ear pain, nausea with some dry heaves. She is tolerating food and fluids like usual.    She has worsening symptoms when she lays down at night  PMH: Smoking status noted ROS: Per HPI  Objective: BP 117/69   Pulse 88   Temp (!) 97 F (36.1 C) (Oral)   Ht 5\' 4"  (1.626 m)   Wt 200 lb 3.2 oz (90.8 kg)   LMP 03/24/2013   SpO2 94%   BMI 34.36 kg/m  Gen: NAD, alert, cooperative with exam HEENT: NCAT, oropharynx moist and clear, TMs normal bilateral CV: RRR, good S1/S2, no murmur Resp: CTABL, no wheezes, non-labored Ext: No edema, warm Neuro: Alert and oriented, No gross deficits  Assessment and plan:  #Sore throat, cough Likely viral etiology She requests cortisone injection which was given, discussed the effects on her blood sugar that it will cause. Symptomatic treatment with Tessalon and Flonase Plain film to rule out underlying pneumonia  Orders Placed This Encounter  Procedures  . Culture, Group A Strep    Order Specific Question:   Source    Answer:   throat  . Rapid Strep Screen ([facility])  . DG Chest 2 View    Standing Status:   Future    Standing Expiration Date:   10/11/2018    Order Specific Question:   Reason for Exam (SYMPTOM  OR DIAGNOSIS REQUIRED)    Answer:   cough- eval for CAP    Order Specific Question:   Is patient pregnant?    Answer:   No    Order Specific Question:   Preferred imaging location?    Answer:   Internal    Order Specific Question:   Radiology Contrast Protocol - do NOT remove file path    Answer:   [email or web address]  . Veritor Flu A/B Waived    Order Specific Question:   Source    Answer:   nasal    Meds ordered this encounter  Medications  . methylPREDNISolone acetate (DEPO-MEDROL) injection 80 mg  . benzonatate (TESSALON PERLES) 100 MG capsule    Sig: Take 1 capsule (100  mg total) by mouth 3 (three) times daily as needed for cough.    Dispense:  20 capsule    Refill:  0  . fluticasone (FLONASE) 50 MCG/ACT nasal spray    Sig: Place 2 sprays into both nostrils daily.    Dispense:  16 g    Refill:  0    Murtis SinkSam Deiondre Harrower, MD [facility] 08/12/2017, 9:11 AM

## 2017-08-12 NOTE — Patient Instructions (Signed)
Great to see you!   

## 2017-08-13 ENCOUNTER — Telehealth: Payer: Self-pay

## 2017-08-13 MED ORDER — AMOXICILLIN-POT CLAVULANATE 875-125 MG PO TABS: 1.0000 | ORAL_TABLET | Freq: Two times a day (BID) | ORAL | 0 refills | Status: DC

## 2017-08-13 NOTE — Telephone Encounter (Signed)
Patient aware that augmentin has been sent to pharmacy

## 2017-08-13 NOTE — Telephone Encounter (Signed)
Rx sent in - augmentin  Murtis SinkSam Bradshaw, MD [facility] 08/13/2017, 10:36 AM

## 2017-08-13 NOTE — Addendum Note (Signed)
Addended by: Elenora GammaBRADSHAW, Aliyanna Wassmer L on: 08/13/2017 10:40 AM   Modules accepted: Orders

## 2017-08-13 NOTE — Telephone Encounter (Signed)
Patient seen Carol Jensen yesterday and states she is worse today.  C/O sore throat, cough and nasal congestion.  Requesting something called into the pharmacy. Please advise

## 2017-08-14 LAB — CULTURE, GROUP A STREP: STREP A CULTURE: NEGATIVE

## 2017-10-22 ENCOUNTER — Encounter: Payer: Self-pay | Admitting: Family Medicine

## 2017-10-22 ENCOUNTER — Ambulatory Visit (INDEPENDENT_AMBULATORY_CARE_PROVIDER_SITE_OTHER): Payer: Medicare Other | Admitting: Family Medicine

## 2017-10-22 VITALS — BP 100/63 | HR 79 | Temp 97.3°F | Ht 64.0 in | Wt 198.0 lb

## 2017-10-22 DIAGNOSIS — K219 Gastro-esophageal reflux disease without esophagitis: Secondary | ICD-10-CM

## 2017-10-22 DIAGNOSIS — F411 Generalized anxiety disorder: Secondary | ICD-10-CM

## 2017-10-22 MED ORDER — RANITIDINE HCL 150 MG PO TABS: 150.0000 mg | ORAL_TABLET | Freq: Two times a day (BID) | ORAL | 3 refills | Status: DC

## 2017-10-22 MED ORDER — DULOXETINE HCL 30 MG PO CPEP: 30.0000 mg | ORAL_CAPSULE | Freq: Every day | ORAL | 3 refills | Status: DC

## 2017-10-22 MED ORDER — OXAZEPAM 15 MG PO CAPS: ORAL_CAPSULE | ORAL | 2 refills | Status: DC

## 2017-10-22 NOTE — Progress Notes (Signed)
   HPI  Patient presents today here for follow-up chronic medical conditions.  Anxiety Doing well with Cymbalta plus oxazepam, needs refill of both. Symptoms are well controlled.  She is working closely with her endocrinologist for diabetic control.  PMH: Smoking status noted ROS: Per HPI  Objective: BP 100/63   Pulse 79   Temp (!) 97.3 F (36.3 C) (Oral)   Ht  (1.626 m)   Wt 198 lb (89.8 kg)   LMP 03/24/2013   BMI 33.99 kg/m  Gen: NAD, alert, cooperative with exam HEENT: NCAT CV: RRR, good S1/S2, no murmur Resp: CTABL, no wheezes, non-labored Ext: No edema, warm Neuro: Alert and oriented, No gross deficits  Assessment and plan:  #Generalized anxiety disorder Stable, refilled Cymbalta plus oxazepam Return to clinic 3 months  GERD Well controlled- refill H2 Blocker  Meds ordered this encounter  Medications  . DULoxetine (CYMBALTA) 30 MG capsule    Sig: Take 1 capsule (30 mg total) by mouth daily.    Dispense:  90 capsule    Refill:  3    This prescription was filled on 05/26/2016. Any refills authorized will be placed on file.  . ranitidine (ZANTAC) 150 MG tablet    Sig: Take 1 tablet (150 mg total) by mouth 2 (two) times daily.    Dispense:  180 tablet    Refill:  3    This prescription was filled on 07/09/2016. Any refills authorized will be placed on file.  . oxazepam (SERAX) 15 MG capsule    Sig: Take 1 Capsule by mouth every 8 hours as needed FOR ANXIETY    Dispense:  40 capsule    Refill:  2    Murtis Sink, MD [facility] 10/22/2017, 11:20 AM

## 2017-11-18 DIAGNOSIS — E1065 Type 1 diabetes mellitus with hyperglycemia: Secondary | ICD-10-CM | POA: Diagnosis not present

## 2017-11-18 DIAGNOSIS — E103519 Type 1 diabetes mellitus with proliferative diabetic retinopathy with macular edema, unspecified eye: Secondary | ICD-10-CM | POA: Diagnosis not present

## 2017-11-18 DIAGNOSIS — E559 Vitamin D deficiency, unspecified: Secondary | ICD-10-CM | POA: Diagnosis not present

## 2017-11-19 DIAGNOSIS — E113593 Type 2 diabetes mellitus with proliferative diabetic retinopathy without macular edema, bilateral: Secondary | ICD-10-CM | POA: Diagnosis not present

## 2017-11-19 DIAGNOSIS — H2511 Age-related nuclear cataract, right eye: Secondary | ICD-10-CM | POA: Diagnosis not present

## 2017-11-19 DIAGNOSIS — Z961 Presence of intraocular lens: Secondary | ICD-10-CM | POA: Diagnosis not present

## 2017-12-17 ENCOUNTER — Other Ambulatory Visit: Payer: Self-pay | Admitting: *Deleted

## 2017-12-17 MED ORDER — TRUEPLUS LANCETS 33G MISC: 3 refills | Status: DC

## 2017-12-20 ENCOUNTER — Telehealth: Payer: Self-pay | Admitting: *Deleted

## 2017-12-20 MED ORDER — GLUCOSE BLOOD VI STRP: ORAL_STRIP | 3 refills | Status: DC

## 2017-12-20 NOTE — Telephone Encounter (Signed)
BS test strips sent to pharmacy

## 2018-01-27 ENCOUNTER — Ambulatory Visit (INDEPENDENT_AMBULATORY_CARE_PROVIDER_SITE_OTHER): Payer: Medicare Other | Admitting: Family Medicine

## 2018-01-27 ENCOUNTER — Encounter: Payer: Self-pay | Admitting: Family Medicine

## 2018-01-27 VITALS — BP 108/69 | HR 79 | Temp 97.4°F | Ht 64.0 in | Wt 207.0 lb

## 2018-01-27 DIAGNOSIS — N183 Chronic kidney disease, stage 3 unspecified: Secondary | ICD-10-CM

## 2018-01-27 DIAGNOSIS — E103599 Type 1 diabetes mellitus with proliferative diabetic retinopathy without macular edema, unspecified eye: Secondary | ICD-10-CM | POA: Diagnosis not present

## 2018-01-27 DIAGNOSIS — E1159 Type 2 diabetes mellitus with other circulatory complications: Secondary | ICD-10-CM | POA: Diagnosis not present

## 2018-01-27 DIAGNOSIS — I152 Hypertension secondary to endocrine disorders: Secondary | ICD-10-CM

## 2018-01-27 DIAGNOSIS — E1022 Type 1 diabetes mellitus with diabetic chronic kidney disease: Secondary | ICD-10-CM

## 2018-01-27 DIAGNOSIS — F411 Generalized anxiety disorder: Secondary | ICD-10-CM

## 2018-01-27 DIAGNOSIS — I10 Essential (primary) hypertension: Secondary | ICD-10-CM

## 2018-01-27 DIAGNOSIS — E113599 Type 2 diabetes mellitus with proliferative diabetic retinopathy without macular edema, unspecified eye: Secondary | ICD-10-CM

## 2018-01-27 MED ORDER — ACETAMINOPHEN-CODEINE #3 300-30 MG PO TABS: ORAL_TABLET | ORAL | 0 refills | Status: DC

## 2018-01-27 MED ORDER — OXAZEPAM 15 MG PO CAPS: ORAL_CAPSULE | ORAL | 2 refills | Status: DC

## 2018-01-27 NOTE — Progress Notes (Signed)
BP 108/69   Pulse 79   Temp (!) 97.4 F (36.3 C) (Oral)   Ht 5\' 4"  (1.626 m)   Wt 207 lb (93.9 kg)   LMP 03/24/2013   BMI 35.53 kg/m    Subjective:    [name], female    DOB: 06-01-1961, 57 y.o.   MRN: [id number]  HPI: Carol Jensen is a 57 y.o. female presenting on 01/27/2018 for Hypertension (3 month follow up) and Anxiety   HPI Hypertension Patient is currently on hydrochlorothiazide and lisinopril, and their blood pressure today is 108/69. Patient denies any lightheadedness or dizziness. Patient denies headaches, blurred vision, chest pains, shortness of breath, or weakness. Denies any side effects from medication and is content with current medication.   Type 1 diabetes mellitus Patient comes in today for recheck of his diabetes. Patient has been currently taking insulin pump and sees endocrinologist for this.  She has had her regular visits with them. Patient is currently on an ACE inhibitor/ARB. Patient has seen an ophthalmologist this year. Patient denies any issues with their feet.  Patient has known stage III renal disease and is followed up with this with her endocrinologist as well who is monitoring and managing these issues along with her cholesterol.  Patient has known retinopathy and follows up regularly with her eye doctor as well.  Anxiety recheck Patient is coming in for anxiety recheck.  She says she has reduced her overall oxazepam intake to 1 a day with the Cymbalta that she has started.  She says she is doing a lot better and will continue to try and reduce.  Patient denies any suicidal thoughts of hurting. [facility] 01/27/2018 10/22/2017 08/12/2017 07/23/2017 05/13/2017  Decreased Interest 0 0 0 0 0  Down, Depressed, Hopeless 0 0 0 0 0  PHQ - 2 Score 0 0 0 0 0     Relevant past medical, surgical, family and social history reviewed and updated as indicated. Interim medical history since our last visit reviewed. Allergies and  medications reviewed and updated.  Review of Systems  Constitutional: Negative for chills and fever.  HENT: Positive for dental problem. Negative for congestion, ear discharge and ear pain.   Eyes: Negative for visual disturbance.  Respiratory: Negative for chest tightness and shortness of breath.   Cardiovascular: Negative for chest pain and leg swelling.  Musculoskeletal: Negative for back pain and gait problem.  Skin: Negative for rash.  Neurological: Negative for light-headedness and headaches.  Psychiatric/Behavioral: Negative for agitation, behavioral problems, decreased concentration, dysphoric mood, self-injury, sleep disturbance and suicidal ideas. The patient is nervous/anxious.   All other systems reviewed and are negative.   Per HPI unless specifically indicated above   Allergies as of 01/27/2018      Reactions   Aspirin    States is not suppose to take due to eye hemorrhages.       Medication List        Accurate as of 01/27/18 11:23 AM. Always use your most recent med list.          acetaminophen 500 MG tablet Commonly known as:  TYLENOL Take 500 mg by mouth every 6 (six) hours as needed.   acetaminophen-codeine 300-30 MG tablet Commonly known as:  TYLENOL #3 every 4 (four) hours as needed.   clotrimazole-betamethasone cream Commonly known as:  LOTRISONE Apply topically 2 (two) times daily.   DULoxetine 30 MG capsule Commonly known as:  CYMBALTA Take 1 capsule (  30 mg total) by mouth daily.   fluticasone 50 MCG/ACT nasal spray Commonly known as:  FLONASE Place 2 sprays into both nostrils daily.   glucose blood test strip Test BS 3 times daily   hydrochlorothiazide 25 MG tablet Commonly known as:  HYDRODIURIL Take 25 mg by mouth daily.   insulin aspart 100 UNIT/ML injection Commonly known as:  novoLOG Use approximately 80 units daily via Insulin Pump  DX E10.9 and Z96.41   lisinopril 10 MG tablet Commonly known as:  PRINIVIL,ZESTRIL Take 1  tablet (10 mg total) by mouth daily.   oxazepam 15 MG capsule Commonly known as:  SERAX Take 1 Capsule by mouth every 8 hours as needed FOR ANXIETY   ranitidine 150 MG tablet Commonly known as:  ZANTAC Take 1 tablet (150 mg total) by mouth 2 (two) times daily.   TRUEPLUS LANCETS 33G Misc Test blood sugars 3 times daily   Vitamin D (Ergocalciferol) 50000 units Caps capsule Commonly known as:  DRISDOL Take 50,000 Units by mouth every 7 (seven) days.   vitamin E 400 UNIT capsule Take by mouth.          Objective:    BP 108/69   Pulse 79   Temp (!) 97.4 F (36.3 C) (Oral)   Ht 5\' 4"  (1.626 m)   Wt 207 lb (93.9 kg)   LMP 03/24/2013   BMI 35.53 kg/m   Wt Readings from Last 3 Encounters:  01/27/18 207 lb (93.9 kg)  10/22/17 198 lb (89.8 kg)  08/12/17 200 lb 3.2 oz (90.8 kg)    Physical Exam  Constitutional: She is oriented to person, place, and time. She appears well-developed and well-nourished. No distress.  Eyes: Pupils are equal, round, and reactive to light. Conjunctivae and EOM are normal.  Neck: Neck supple. No thyromegaly present.  Cardiovascular: Normal rate, regular rhythm, normal heart sounds and intact distal pulses.  No murmur heard. Pulmonary/Chest: Effort normal and breath sounds normal. No respiratory distress. She has no wheezes.  Musculoskeletal: Normal range of motion. She exhibits no edema or tenderness.  Lymphadenopathy:    She has no cervical adenopathy.  Neurological: She is alert and oriented to person, place, and time. Coordination normal.  Skin: Skin is warm and dry. No rash noted. She is not diaphoretic.  Psychiatric: Her behavior is normal. Her mood appears anxious. She does not exhibit a depressed mood. She expresses no suicidal ideation. She expresses no suicidal plans.  Nursing note and vitals reviewed.       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  She does not use the Tylenol 3 very frequently, her last refill was almost 6 months ago Follow up plan: Return in about 3 months (around 04/29/2018), or if symptoms worsen or fail to improve, for Anxiety and hypertension recheck.  Counseling provided for  all of the vaccine components No orders of the defined types were placed in this encounter.   Arville Care, MD [facility] 01/27/2018, 11:23 AM

## 2018-02-18 DIAGNOSIS — E1065 Type 1 diabetes mellitus with hyperglycemia: Secondary | ICD-10-CM | POA: Diagnosis not present

## 2018-02-18 DIAGNOSIS — E559 Vitamin D deficiency, unspecified: Secondary | ICD-10-CM | POA: Diagnosis not present

## 2018-02-18 DIAGNOSIS — E103519 Type 1 diabetes mellitus with proliferative diabetic retinopathy with macular edema, unspecified eye: Secondary | ICD-10-CM | POA: Diagnosis not present

## 2018-02-28 ENCOUNTER — Encounter: Payer: Self-pay | Admitting: Family

## 2018-02-28 ENCOUNTER — Ambulatory Visit (INDEPENDENT_AMBULATORY_CARE_PROVIDER_SITE_OTHER): Payer: Medicare Other | Admitting: Family

## 2018-02-28 VITALS — BP 108/69 | HR 81 | Temp 97.0°F | Ht 64.0 in | Wt 206.8 lb

## 2018-02-28 DIAGNOSIS — B9689 Other specified bacterial agents as the cause of diseases classified elsewhere: Secondary | ICD-10-CM

## 2018-02-28 DIAGNOSIS — J208 Acute bronchitis due to other specified organisms: Secondary | ICD-10-CM

## 2018-02-28 MED ORDER — AZITHROMYCIN 250 MG PO TABS: ORAL_TABLET | ORAL | 0 refills | Status: DC

## 2018-02-28 MED ORDER — BENZONATATE 200 MG PO CAPS: 200.0000 mg | ORAL_CAPSULE | Freq: Three times a day (TID) | ORAL | 1 refills | Status: DC | PRN

## 2018-02-28 MED ORDER — FLUTICASONE PROPIONATE 50 MCG/ACT NA SUSP: 2.0000 | Freq: Every day | NASAL | 6 refills | Status: DC

## 2018-02-28 NOTE — Patient Instructions (Signed)

## 2018-02-28 NOTE — Progress Notes (Signed)
   Subjective:    Patient ID: Carol BenceMelia R Jensen, female    DOB: 03-24-1961, 57 y.o.   MRN: [id number]  Chief Complaint  Patient presents with  . Cough  . Hoarse    Cough  This is a new problem. The current episode started in the past 7 days. The problem has been waxing and waning. The problem occurs every few minutes. The cough is non-productive. Associated symptoms include headaches, nasal congestion, postnasal drip and a sore throat. Pertinent negatives include no chills, ear congestion, ear pain, fever, myalgias or rhinorrhea. The symptoms are aggravated by lying down. She has tried OTC cough suppressant for the symptoms. The treatment provided mild relief.      Review of Systems  Constitutional: Negative for chills and fever.  HENT: Positive for postnasal drip and sore throat. Negative for ear pain and rhinorrhea.   Respiratory: Positive for cough.   Musculoskeletal: Negative for myalgias.  Neurological: Positive for headaches.  All other systems reviewed and are negative.      Objective:   Physical Exam  Constitutional: She is oriented to person, place, and time. She appears well-developed and well-nourished. She has a sickly appearance. No distress.  HENT:  Head: Normocephalic and atraumatic.  Right Ear: External ear normal.  Left Ear: External ear normal.  Nose: Mucosal edema and rhinorrhea present.  Mouth/Throat: Oropharynx is clear and moist.  Eyes: Pupils are equal, round, and reactive to light.  Neck: Normal range of motion. Neck supple. No thyromegaly present.  Cardiovascular: Normal rate, regular rhythm, normal heart sounds and intact distal pulses.  No murmur heard. Pulmonary/Chest: Effort normal and breath sounds normal. No respiratory distress. She has no wheezes.  Intermittent nonproductive cough  Abdominal: Soft. Bowel sounds are normal. She exhibits no distension. There is no tenderness.  Musculoskeletal: Normal range of motion. She exhibits no edema or  tenderness.  Neurological: She is alert and oriented to person, place, and time. She has normal reflexes. No cranial nerve deficit.  Skin: Skin is warm and dry.  Psychiatric: She has a normal mood and affect. Her behavior is normal. Judgment and thought content normal.  Vitals reviewed.     BP 108/69   Pulse 81   Temp (!) 97 F (36.1 C) (Oral)   Ht 5\' 4"  (1.626 m)   Wt 206 lb 12.8 oz (93.8 kg)   LMP 03/24/2013   BMI 35.50 kg/m      Assessment & Plan:  Carol Jensen comes in today with chief complaint of Cough and Hoarse   Diagnosis and orders addressed:  1. Acute bacterial bronchitis - Take meds as prescribed - Use a cool mist humidifier  -Use saline nose sprays frequently -Force fluids -For any cough or congestion  Use plain Mucinex- regular strength or max strength is fine -For fever or aces or pains- take tylenol or ibuprofen. -Throat lozenges if help -New toothbrush in 3 days RTO if symptoms worsen or do not improve  - azithromycin (ZITHROMAX) 250 MG tablet; Take 500 mg once, then 250 mg for four days  Dispense: 6 tablet; Refill: 0 - benzonatate (TESSALON) 200 MG capsule; Take 1 capsule (200 mg total) by mouth 3 (three) times daily as needed.  Dispense: 30 capsule; Refill: 1 - fluticasone (FLONASE) 50 MCG/ACT nasal spray; Place 2 sprays into both nostrils daily.  Dispense: 16 g; Refill: 6  Carol Rodneyhristy Lachina Salsberry, FNP

## 2018-03-12 ENCOUNTER — Telehealth: Payer: Self-pay | Admitting: Family Medicine

## 2018-03-12 DIAGNOSIS — B9689 Other specified bacterial agents as the cause of diseases classified elsewhere: Secondary | ICD-10-CM

## 2018-03-12 DIAGNOSIS — J208 Acute bronchitis due to other specified organisms: Principal | ICD-10-CM

## 2018-03-12 MED ORDER — PHENOL 1.4 % MT LIQD: 1.0000 | OROMUCOSAL | 0 refills | Status: DC | PRN

## 2018-03-12 MED ORDER — FLUTICASONE PROPIONATE 50 MCG/ACT NA SUSP: 2.0000 | Freq: Every day | NASAL | 6 refills | Status: DC

## 2018-03-12 NOTE — Telephone Encounter (Signed)
What symptoms do you have? Hoarseness   How long have you been sick? Was diagnosed last week with bronchitis she feels better but is still hoarse wants medication  Have you been seen for this problem? Yes last week by Neysa Bonitohristy  If your provider decides to give you a prescription, which pharmacy would you [facility]   Patient informed that this information will be sent to the clinical staff for review and that they should receive a follow up call.

## 2018-03-12 NOTE — Telephone Encounter (Signed)
Patient aware verbalized understanding

## 2018-03-12 NOTE — Telephone Encounter (Signed)
Please let patient know that I sent some Flonase and Chloraseptic spray that she can use, if anything worsens or does not improve she can come in and see [place].

## 2018-03-17 ENCOUNTER — Telehealth: Payer: Self-pay | Admitting: Family Medicine

## 2018-03-17 MED ORDER — OMEPRAZOLE 20 MG PO CPDR: 20.0000 mg | DELAYED_RELEASE_CAPSULE | Freq: Every day | ORAL | 3 refills | Status: DC

## 2018-03-17 NOTE — Telephone Encounter (Signed)
Patient takes Zantac, but has been taking off the shelf. Wants omperazole 20mg  called into [facility] pharmacy. Please advise. Patient is c/o horsness in  voice wants something called in for that. Patient has n/o other symptoms but horsenss no congestion. Please advise

## 2018-03-17 NOTE — Telephone Encounter (Signed)
Please tell the patient that I called her in omeprazole which acid reflux can be a cause of hoarseness, if she continues to have hoarseness in 3 or 4 weeks despite being on the omeprazole every day then she likely will need to go see an ENT doctor.  There also may be an issue with her lisinopril which we could discuss that in the future of switching it to something else

## 2018-03-17 NOTE — Telephone Encounter (Signed)
Patient aware and verbalizes understanding. 

## 2018-03-24 ENCOUNTER — Ambulatory Visit (INDEPENDENT_AMBULATORY_CARE_PROVIDER_SITE_OTHER): Payer: Medicare Other

## 2018-03-24 DIAGNOSIS — Z23 Encounter for immunization: Secondary | ICD-10-CM

## 2018-04-30 ENCOUNTER — Encounter: Payer: Self-pay | Admitting: Family Medicine

## 2018-04-30 ENCOUNTER — Ambulatory Visit (INDEPENDENT_AMBULATORY_CARE_PROVIDER_SITE_OTHER): Payer: Medicare Other | Admitting: Family Medicine

## 2018-04-30 VITALS — BP 114/70 | HR 76 | Temp 97.0°F | Ht 64.0 in | Wt 214.6 lb

## 2018-04-30 DIAGNOSIS — E103599 Type 1 diabetes mellitus with proliferative diabetic retinopathy without macular edema, unspecified eye: Secondary | ICD-10-CM

## 2018-04-30 DIAGNOSIS — N183 Chronic kidney disease, stage 3 unspecified: Secondary | ICD-10-CM

## 2018-04-30 DIAGNOSIS — E113599 Type 2 diabetes mellitus with proliferative diabetic retinopathy without macular edema, unspecified eye: Secondary | ICD-10-CM

## 2018-04-30 DIAGNOSIS — F411 Generalized anxiety disorder: Secondary | ICD-10-CM | POA: Diagnosis not present

## 2018-04-30 DIAGNOSIS — E1022 Type 1 diabetes mellitus with diabetic chronic kidney disease: Secondary | ICD-10-CM | POA: Diagnosis not present

## 2018-04-30 MED ORDER — ACETAMINOPHEN-CODEINE #3 300-30 MG PO TABS: ORAL_TABLET | ORAL | 0 refills | Status: DC

## 2018-04-30 MED ORDER — OXAZEPAM 15 MG PO CAPS: ORAL_CAPSULE | ORAL | 2 refills | Status: DC

## 2018-04-30 NOTE — Progress Notes (Signed)
BP 114/70   Pulse 76   Temp (!) 97 F (36.1 C) (Oral)   Ht 5\' 4"  (1.626 m)   Wt 214 lb 9.6 oz (97.3 kg)   LMP 03/24/2013   BMI 36.84 kg/m    Subjective:    [name], female    DOB: Apr 10, 1961, 57 y.o.   MRN: [id number]  HPI: Carol Jensen is a 57 y.o. female presenting on 04/30/2018 for Hypertension (3 month follow up) and Anxiety   HPI Hypertension Patient is currently on lisinopril and hydrochlorothiazide, and their blood pressure today is 114/70. Patient denies any lightheadedness or dizziness. Patient denies headaches, blurred vision, chest pains, shortness of breath, or weakness. Denies any side effects from medication and is content with current medication.   Anxiety Patient has anxiety and currently takes the Cymbalta and oxazepam for this and says that they are working well for her.  She denies any major issues with anxiety currently as long as she keeps those medication.  She says she takes them in the morning and and she feels calm throughout the day. [facility] 04/30/2018 02/28/2018 01/27/2018 10/22/2017 08/12/2017  Decreased Interest 0 0 0 0 0  Down, Depressed, Hopeless 0 0 0 0 0  PHQ - 2 Score 0 0 0 0 0     Patient has type 1 diabetes and diabetic retinopathy and stage III CKD which is being managed by her endocrinologist and she will follow-up with them for this.  Relevant past medical, surgical, family and social history reviewed and updated as indicated. Interim medical history since our last visit reviewed. Allergies and medications reviewed and updated.  Review of Systems  Constitutional: Negative for chills and fever.  Eyes: Negative for redness and visual disturbance.  Respiratory: Negative for chest tightness and shortness of breath.   Cardiovascular: Negative for chest pain and leg swelling.  Musculoskeletal: Negative for back pain and gait problem.  Skin: Negative for rash.  Neurological: Negative for light-headedness and  headaches.  Psychiatric/Behavioral: Negative for agitation, behavioral problems, self-injury, sleep disturbance and suicidal ideas. The patient is not nervous/anxious.   All other systems reviewed and are negative.   Per HPI unless specifically indicated above   Allergies as of 04/30/2018      Reactions   Aspirin    States is not suppose to take due to eye hemorrhages.       Medication List        Accurate as of 04/30/18 10:42 AM. Always use your most recent med list.          acetaminophen 500 MG tablet Commonly known as:  TYLENOL Take 500 mg by mouth every 6 (six) hours as needed.   acetaminophen-codeine 300-30 MG tablet Commonly known as:  TYLENOL #3 every 4 (four) hours as needed.   clotrimazole-betamethasone cream Commonly known as:  LOTRISONE Apply topically 2 (two) times daily.   DULoxetine 30 MG capsule Commonly known as:  CYMBALTA Take 1 capsule (30 mg total) by mouth daily.   glucose blood test strip Test BS 3 times daily   hydrochlorothiazide 25 MG tablet Commonly known as:  HYDRODIURIL Take 25 mg by mouth daily.   insulin aspart 100 UNIT/ML injection Commonly known as:  novoLOG Use approximately 80 units daily via Insulin Pump  DX E10.9 and Z96.41   lisinopril 10 MG tablet Commonly known as:  PRINIVIL,ZESTRIL Take 1 tablet (10 mg total) by mouth daily.   omeprazole 20 MG capsule Commonly  known as:  PRILOSEC Take 1 capsule (20 mg total) by mouth daily.   oxazepam 15 MG capsule Commonly known as:  SERAX Take 1 Capsule by mouth every 8 hours as needed FOR ANXIETY   TRUEPLUS LANCETS 33G Misc Test blood sugars 3 times daily   Vitamin D (Ergocalciferol) 1.25 MG (50000 UT) Caps capsule Commonly known as:  DRISDOL Take 50,000 Units by mouth every 7 (seven) days.          Objective:    BP 114/70   Pulse 76   Temp (!) 97 F (36.1 C) (Oral)   Ht 5\' 4"  (1.626 m)   Wt 214 lb 9.6 oz (97.3 kg)   LMP 03/24/2013   BMI 36.84 kg/m   Wt  Readings from Last 3 Encounters:  04/30/18 214 lb 9.6 oz (97.3 kg)  02/28/18 206 lb 12.8 oz (93.8 kg)  01/27/18 207 lb (93.9 kg)    Physical Exam  Constitutional: She is oriented to person, place, and time. She appears well-developed and well-nourished. No distress.  Eyes: Conjunctivae are normal.  Neck: Neck supple. No thyromegaly present.  Cardiovascular: Normal rate, regular rhythm, normal heart sounds and intact distal pulses.  No murmur heard. Pulmonary/Chest: Effort normal and breath sounds normal. No respiratory distress. She has no wheezes.  Musculoskeletal: Normal range of motion. She exhibits no edema.  Lymphadenopathy:    She has no cervical adenopathy.  Neurological: She is alert and oriented to person, place, and time. Coordination normal.  Skin: Skin is warm and dry. No rash noted. She is not diaphoretic.  Psychiatric: Her behavior is normal. Her mood appears anxious. She does not exhibit a depressed mood. She expresses no suicidal ideation. She expresses no suicidal plans.  Nursing note and vitals reviewed.       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  Just had blood work in September, 2 months ago through her endocrinologist, is in care everywhere.  Follow up plan: Return in about 3 months (around 07/31/2018), or if symptoms worsen or fail to improve, for Anxiety recheck.  Counseling provided for all of the vaccine components No orders of the defined types were placed in this encounter.   Arville Care, MD [facility] 04/30/2018, 10:42 AM

## 2018-05-17 IMAGING — DX DG CHEST 2V
2 series · 2 of 2 positions shown · non-contrast
Comparison: December 20, 2011

CLINICAL DATA: Cough

EXAM:
CHEST  2 VIEW

[chest pa]
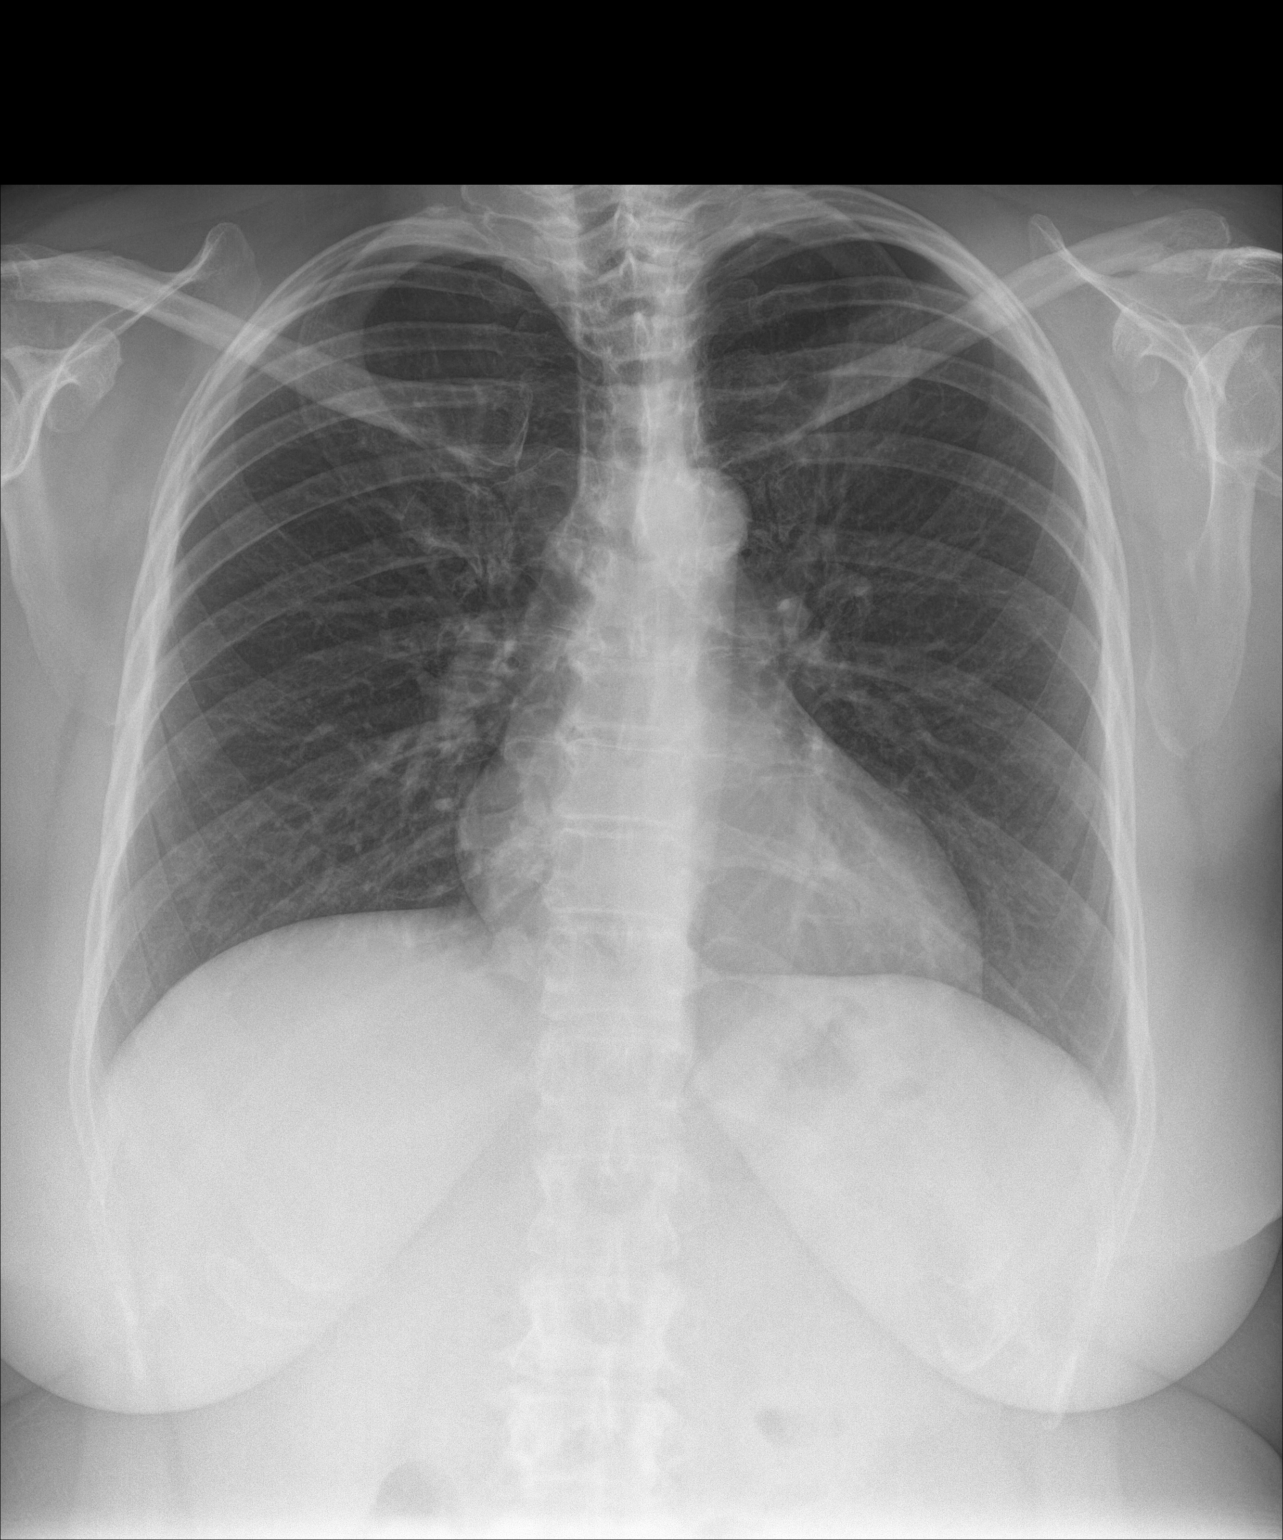

[chest lat]
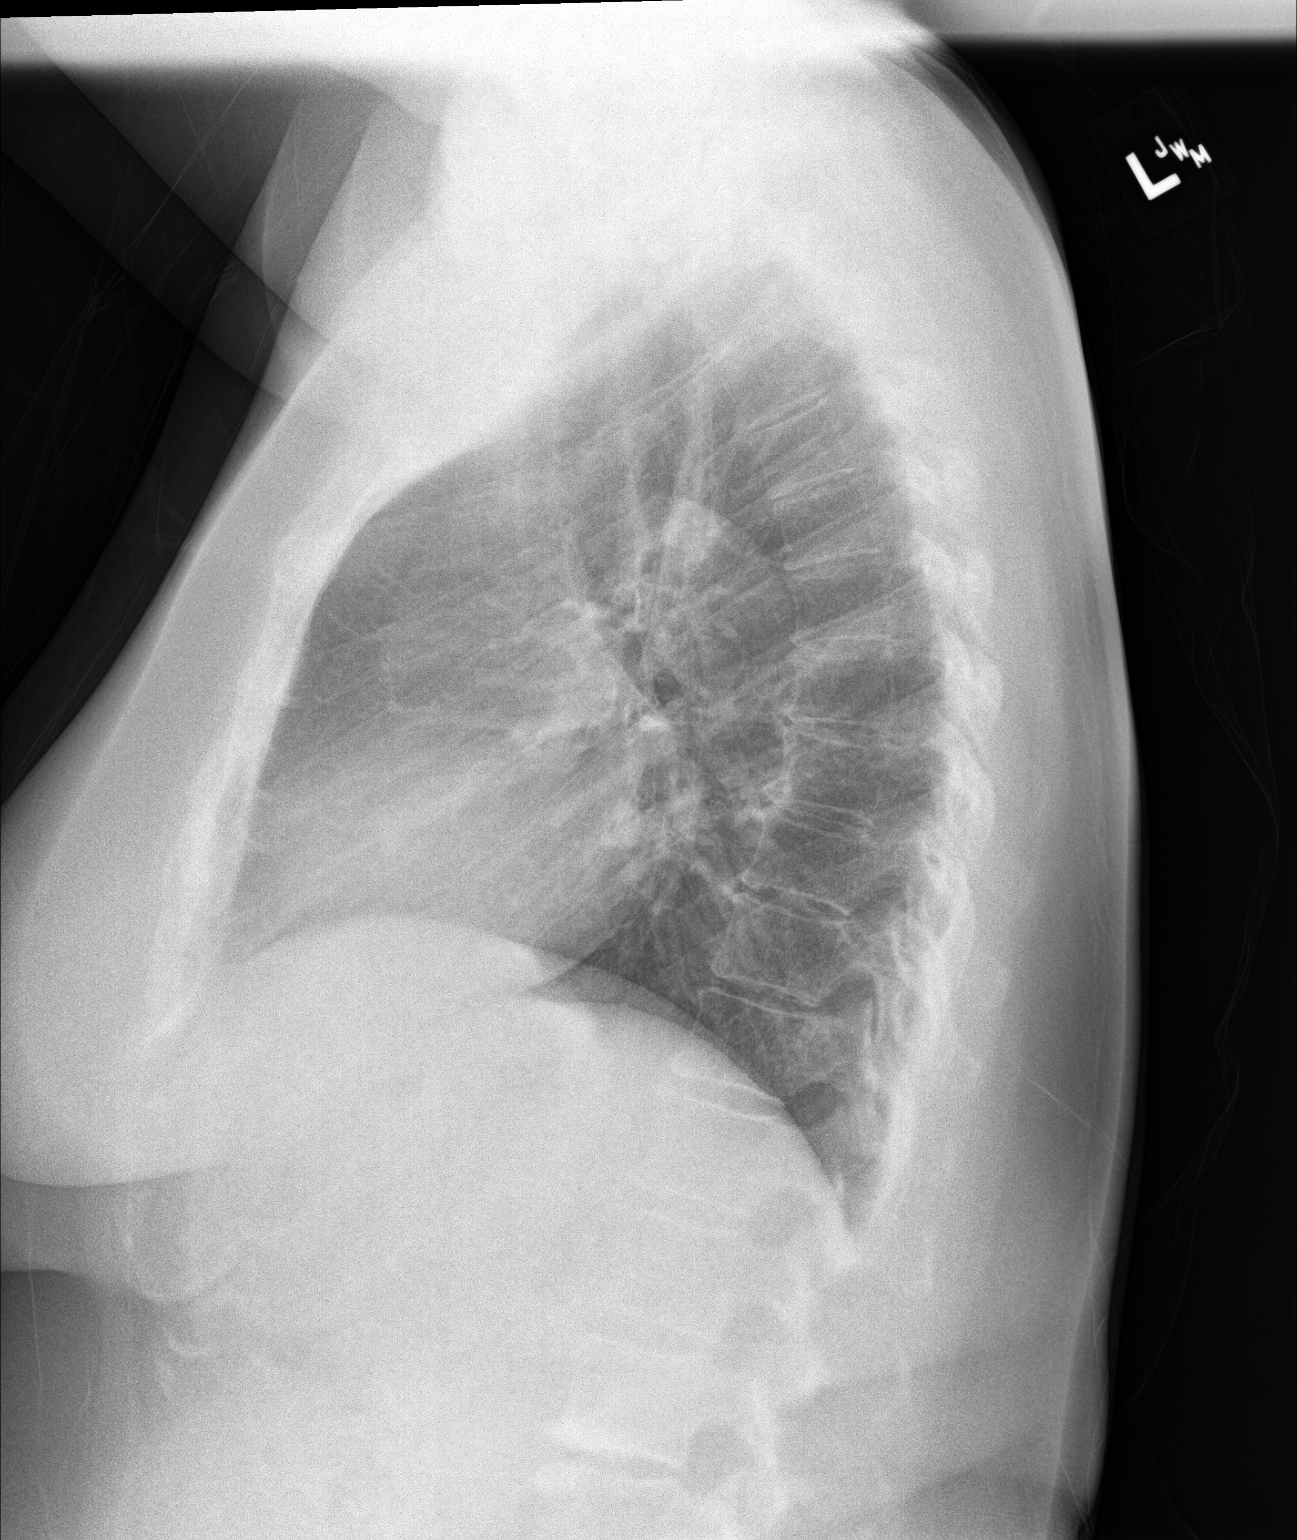

[2 of 2 positions shown; findings below may reference images not displayed]

FINDINGS: There is no edema or consolidation. The heart size and pulmonary
vascularity are normal. No adenopathy. There is aortic
atherosclerosis. No evident bone lesions.
IMPRESSION: Aortic atherosclerosis.  No edema or consolidation.

Aortic Atherosclerosis ([id number]-FSZ.Z).

## 2018-06-04 ENCOUNTER — Other Ambulatory Visit: Payer: Self-pay | Admitting: Family Medicine

## 2018-06-30 ENCOUNTER — Other Ambulatory Visit: Payer: Self-pay | Admitting: Family Medicine

## 2018-07-07 DIAGNOSIS — Z9641 Presence of insulin pump (external) (internal): Secondary | ICD-10-CM | POA: Diagnosis not present

## 2018-07-07 DIAGNOSIS — E103519 Type 1 diabetes mellitus with proliferative diabetic retinopathy with macular edema, unspecified eye: Secondary | ICD-10-CM | POA: Diagnosis not present

## 2018-07-07 DIAGNOSIS — E559 Vitamin D deficiency, unspecified: Secondary | ICD-10-CM | POA: Diagnosis not present

## 2018-07-07 DIAGNOSIS — E1065 Type 1 diabetes mellitus with hyperglycemia: Secondary | ICD-10-CM | POA: Diagnosis not present

## 2018-08-04 ENCOUNTER — Ambulatory Visit (INDEPENDENT_AMBULATORY_CARE_PROVIDER_SITE_OTHER): Payer: Medicare Other | Admitting: Family Medicine

## 2018-08-04 ENCOUNTER — Encounter: Payer: Self-pay | Admitting: Family Medicine

## 2018-08-04 VITALS — BP 113/69 | HR 90 | Temp 97.2°F | Ht 64.0 in | Wt 223.4 lb

## 2018-08-04 DIAGNOSIS — F411 Generalized anxiety disorder: Secondary | ICD-10-CM

## 2018-08-04 DIAGNOSIS — I10 Essential (primary) hypertension: Secondary | ICD-10-CM | POA: Diagnosis not present

## 2018-08-04 DIAGNOSIS — E1159 Type 2 diabetes mellitus with other circulatory complications: Secondary | ICD-10-CM | POA: Diagnosis not present

## 2018-08-04 DIAGNOSIS — I152 Hypertension secondary to endocrine disorders: Secondary | ICD-10-CM

## 2018-08-04 MED ORDER — OXAZEPAM 15 MG PO CAPS: ORAL_CAPSULE | ORAL | 3 refills | Status: DC

## 2018-08-04 MED ORDER — OMEPRAZOLE 20 MG PO CPDR: 20.0000 mg | DELAYED_RELEASE_CAPSULE | Freq: Every day | ORAL | 3 refills | Status: DC

## 2018-08-04 MED ORDER — ACETAMINOPHEN-CODEINE #3 300-30 MG PO TABS: ORAL_TABLET | ORAL | 0 refills | Status: DC

## 2018-08-04 MED ORDER — DULOXETINE HCL 30 MG PO CPEP: 30.0000 mg | ORAL_CAPSULE | Freq: Every day | ORAL | 3 refills | Status: DC

## 2018-08-04 NOTE — Progress Notes (Signed)
BP 113/69   Pulse 90   Temp (!) 97.2 F (36.2 C) (Oral)   Ht 5\' 4"  (1.626 m)   Wt 223 lb 6.4 oz (101.3 kg)   LMP 03/24/2013   BMI 38.35 kg/m    Subjective:    [name], female    DOB: [birth year], 58 y.o.   MRN: [id number]  HPI: Carol Jensen is a 58 y.o. female presenting on 08/04/2018 for Anxiety (3 month follow up) and Hypertension   HPI Hypertension Patient is currently on lisinopril and hydrochlorothiazide, and their blood pressure today is 113/69. Patient denies any lightheadedness or dizziness. Patient denies headaches, blurred vision, chest pains, shortness of breath, or weakness. Denies any side effects from medication and is content with current medication.   Patient is coming in today for recheck on anxiety and refill on medication.  She is currently taking oxazepam and Cymbalta to help with anxiety and sleep.  She says they are working well for her currently.  She denies any suicidal ideations or thoughts herself. Depression screen [facility] 2/9 08/04/2018 04/30/2018 02/28/2018 01/27/2018 10/22/2017  Decreased Interest 0 0 0 0 0  Down, Depressed, Hopeless 0 0 0 0 0  PHQ - 2 Score 0 0 0 0 0    Discussed weight management options with patient and she is trying to make some changes in her lifestyle.   Relevant past medical, surgical, family and social history reviewed and updated as indicated. Interim medical history since our last visit reviewed. Allergies and medications reviewed and updated.  Review of Systems  Constitutional: Negative for chills and fever.  Eyes: Negative for visual disturbance.  Respiratory: Negative for chest tightness and shortness of breath.   Cardiovascular: Negative for chest pain and leg swelling.  Musculoskeletal: Negative for back pain and gait problem.  Skin: Negative for rash.  Neurological: Negative for light-headedness and headaches.  Psychiatric/Behavioral: Positive for dysphoric mood. Negative for agitation, behavioral  problems, self-injury, sleep disturbance and suicidal ideas. The patient is nervous/anxious.   All other systems reviewed and are negative.   Per HPI unless specifically indicated above   Allergies as of 08/04/2018      Reactions   Aspirin    States is not suppose to take due to eye hemorrhages.       Medication List       Accurate as of August 04, 2018 11:07 AM. Always use your most recent med list.        acetaminophen 500 MG tablet Commonly known as:  TYLENOL Take 500 mg by mouth every 6 (six) hours as needed.   acetaminophen-codeine 300-30 MG tablet Commonly known as:  TYLENOL #3 every 4 (four) hours as needed.   clotrimazole-betamethasone cream Commonly known as:  LOTRISONE Apply topically 2 (two) times daily.   DULoxetine 30 MG capsule Commonly known as:  CYMBALTA Take 1 capsule (30 mg total) by mouth daily.   glucose blood test strip Commonly known as:  CONTOUR NEXT TEST Test BS 3 times daily   hydrochlorothiazide 25 MG tablet Commonly known as:  HYDRODIURIL Take 25 mg by mouth daily.   insulin aspart 100 UNIT/ML injection Commonly known as:  novoLOG Use approximately 80 units daily via Insulin Pump  DX E10.9 and Z96.41   lisinopril 10 MG tablet Commonly known as:  PRINIVIL,ZESTRIL Take 1 tablet (10 mg total) by mouth daily.   omeprazole 20 MG capsule Commonly known as:  PRILOSEC Take 1 capsule (20 mg total) by mouth  daily.   oxazepam 15 MG capsule Commonly known as:  SERAX Take 1 Capsule by mouth every 8 hours as needed FOR ANXIETY   TRUEPLUS LANCETS 33G Misc Test blood sugars 3 times daily   Vitamin D (Ergocalciferol) 1.25 MG (50000 UT) Caps capsule Commonly known as:  DRISDOL Take 50,000 Units by mouth every 7 (seven) days.          Objective:    BP 113/69   Pulse 90   Temp (!) 97.2 F (36.2 C) (Oral)   Ht 5\' 4"  (1.626 m)   Wt 223 lb 6.4 oz (101.3 kg)   LMP 03/24/2013   BMI 38.35 kg/m   Wt Readings from Last 3 Encounters:    08/04/18 223 lb 6.4 oz (101.3 kg)  04/30/18 214 lb 9.6 oz (97.3 kg)  02/28/18 206 lb 12.8 oz (93.8 kg)    Physical Exam Vitals signs and nursing note reviewed.  Constitutional:      General: She is not in acute distress.    Appearance: She is well-developed. She is not diaphoretic.  Eyes:     Conjunctiva/sclera: Conjunctivae normal.  Cardiovascular:     Rate and Rhythm: Normal rate and regular rhythm.     Heart sounds: Normal heart sounds. No murmur.  Pulmonary:     Effort: Pulmonary effort is normal. No respiratory distress.     Breath sounds: Normal breath sounds. No wheezing.  Musculoskeletal: Normal range of motion.        General: No tenderness.  Skin:    General: Skin is warm and dry.     Findings: No rash.  Neurological:     Mental Status: She is alert and oriented to person, place, and time.     Coordination: Coordination normal.  Psychiatric:        Mood and Affect: Mood is anxious and depressed.        Behavior: Behavior normal.        Thought Content: Thought content does not include suicidal ideation. Thought content does not include suicidal plan.       Assessment & Plan:   Problem List Items Addressed This Visit      Cardiovascular and Mediastinum   Hypertension associated with diabetes (HCC)     Other   GAD (generalized anxiety disorder) - Primary   Relevant Medications   oxazepam (SERAX) 15 MG capsule   DULoxetine (CYMBALTA) 30 MG capsule   Morbid obesity (HCC)      Refill serax and cymbalta  Follow up plan: Return in about 4 months (around 12/03/2018), or if symptoms worsen or fail to improve, for Anxiety and hypertension recheck.  Counseling provided for all of the vaccine components No orders of the defined types were placed in this encounter.   Arville Care, MD [facility] 08/04/2018, 11:07 AM

## 2018-08-29 ENCOUNTER — Telehealth: Payer: Self-pay | Admitting: Family Medicine

## 2018-08-29 NOTE — Telephone Encounter (Signed)
Spoke to triage 

## 2018-09-19 DIAGNOSIS — E109 Type 1 diabetes mellitus without complications: Secondary | ICD-10-CM | POA: Diagnosis not present

## 2018-10-01 DIAGNOSIS — E103519 Type 1 diabetes mellitus with proliferative diabetic retinopathy with macular edema, unspecified eye: Secondary | ICD-10-CM | POA: Diagnosis not present

## 2018-10-01 DIAGNOSIS — E1065 Type 1 diabetes mellitus with hyperglycemia: Secondary | ICD-10-CM | POA: Diagnosis not present

## 2018-10-06 DIAGNOSIS — Z9641 Presence of insulin pump (external) (internal): Secondary | ICD-10-CM | POA: Diagnosis not present

## 2018-10-06 DIAGNOSIS — E103519 Type 1 diabetes mellitus with proliferative diabetic retinopathy with macular edema, unspecified eye: Secondary | ICD-10-CM | POA: Diagnosis not present

## 2018-10-06 DIAGNOSIS — E1065 Type 1 diabetes mellitus with hyperglycemia: Secondary | ICD-10-CM | POA: Diagnosis not present

## 2018-10-07 DIAGNOSIS — E109 Type 1 diabetes mellitus without complications: Secondary | ICD-10-CM | POA: Diagnosis not present

## 2018-11-05 ENCOUNTER — Other Ambulatory Visit: Payer: Self-pay

## 2018-11-05 ENCOUNTER — Encounter: Payer: Self-pay | Admitting: Family Medicine

## 2018-11-05 ENCOUNTER — Ambulatory Visit (INDEPENDENT_AMBULATORY_CARE_PROVIDER_SITE_OTHER): Payer: Medicare Other | Admitting: Family Medicine

## 2018-11-05 DIAGNOSIS — E1159 Type 2 diabetes mellitus with other circulatory complications: Secondary | ICD-10-CM

## 2018-11-05 DIAGNOSIS — I10 Essential (primary) hypertension: Secondary | ICD-10-CM

## 2018-11-05 DIAGNOSIS — M19011 Primary osteoarthritis, right shoulder: Secondary | ICD-10-CM | POA: Diagnosis not present

## 2018-11-05 DIAGNOSIS — I152 Hypertension secondary to endocrine disorders: Secondary | ICD-10-CM

## 2018-11-05 DIAGNOSIS — M19012 Primary osteoarthritis, left shoulder: Secondary | ICD-10-CM

## 2018-11-05 DIAGNOSIS — F411 Generalized anxiety disorder: Secondary | ICD-10-CM | POA: Diagnosis not present

## 2018-11-05 MED ORDER — ACETAMINOPHEN-CODEINE #3 300-30 MG PO TABS: ORAL_TABLET | ORAL | 0 refills | Status: DC

## 2018-11-05 NOTE — Progress Notes (Signed)
Virtual Visit via telephone Note  I connected with Carol Jensen on 11/05/18 at 1047 by telephone and verified that I am speaking with the correct person using two identifiers. Carol Jensen is currently located at home and mother are currently with her during visit. The provider, Elige Radon Dettinger, MD is located in their office at time of visit.  Call ended at 1059  I discussed the limitations, risks, security and privacy concerns of performing an evaluation and management service by telephone and the availability of in person appointments. I also discussed with the patient that there may be a patient responsible charge related to this service. The patient expressed understanding and agreed to proceed.   History and Present Illness: Hypertension Patient is currently on hctz, lisinopril, and their blood pressure today is unknown . Patient denies any lightheadedness or dizziness. Patient denies headaches, blurred vision, chest pains, shortness of breath, or weakness. Denies any side effects from medication and is content with current medication.   Anxiety Patient is taking cymbalta and oxazepam. Denies any major depression or panic attacks.  She feels like her medication is doing well right now.   Pain assessment: Cause of pain-arthritis in shoulders and sometimes in back Pain location-shoulders and back Pain on scale of 1-10- 1 Frequency-every few days or couple times a month What increases pain-certain movements or overactivity What makes pain Better-stretching and when it is really bad the medication Tylenol with codeine. Effects on ADL -minimal to none Any change in general medical condition-none  Current opioids rx-Tylenol with codeine 500 mg every 4 hours as needed # meds rx- 30 Effectiveness of current meds-does well, only uses it as needed Adverse reactions form pain meds-none Morphine equivalent- 4.5  Pill count performed-No Last drug screen -unknown ( high risk q15m,  moderate risk q89m, low risk yearly ) Urine drug screen today- No Was the [facility] reviewed-yes  If yes were their any concerning findings? -None  No flowsheet data found.   Pain contract signed on: None  No diagnosis found.  Outpatient Encounter Medications as of 11/05/2018  Medication Sig  . acetaminophen (TYLENOL) 500 MG tablet Take 500 mg by mouth every 6 (six) hours as needed.  Marland Kitchen acetaminophen-codeine (TYLENOL #3) 300-30 MG tablet every 4 (four) hours as needed.  . clotrimazole-betamethasone (LOTRISONE) cream Apply topically 2 (two) times daily.  . DULoxetine (CYMBALTA) 30 MG capsule Take 1 capsule (30 mg total) by mouth daily.  Marland Kitchen glucose blood (CONTOUR NEXT TEST) test strip Test BS 3 times daily  . hydrochlorothiazide (HYDRODIURIL) 25 MG tablet Take 25 mg by mouth daily.  . insulin aspart (NOVOLOG) 100 UNIT/ML injection Use approximately 80 units daily via Insulin Pump  DX E10.9 and Z96.41  . lisinopril (PRINIVIL,ZESTRIL) 10 MG tablet Take 1 tablet (10 mg total) by mouth daily.  Marland Kitchen omeprazole (PRILOSEC) 20 MG capsule Take 1 capsule (20 mg total) by mouth daily.  Marland Kitchen oxazepam (SERAX) 15 MG capsule Take 1 Capsule by mouth every 8 hours as needed FOR ANXIETY  . TRUEPLUS LANCETS 33G MISC Test blood sugars 3 times daily  . Vitamin D, Ergocalciferol, (DRISDOL) 50000 units CAPS capsule Take 50,000 Units by mouth every 7 (seven) days.    No facility-administered encounter medications on file as of 11/05/2018.     Review of Systems  Constitutional: Negative for chills and fever.  Eyes: Negative for visual disturbance.  Respiratory: Negative for chest tightness and shortness of breath.   Cardiovascular: Negative for chest pain and  leg swelling.  Musculoskeletal: Negative for back pain and gait problem.  Skin: Negative for rash.  Neurological: Negative for light-headedness and headaches.  Psychiatric/Behavioral: Positive for sleep disturbance. Negative for agitation, behavioral problems,  decreased concentration, dysphoric mood, self-injury and suicidal ideas. The patient is nervous/anxious.   All other systems reviewed and are negative.   Observations/Objective: Patient sounds comfortable and in no acute distress  Assessment and Plan: Problem List Items Addressed This Visit      Cardiovascular and Mediastinum   Hypertension associated with diabetes (HCC) - Primary     Other   GAD (generalized anxiety disorder)    Other Visit Diagnoses    Bilateral shoulder region arthritis       Relevant Medications   acetaminophen-codeine (TYLENOL #3) 300-30 MG tablet       Follow Up Instructions: Follow-up in 3 months    I discussed the assessment and treatment plan with the patient. The patient was provided an opportunity to ask questions and all were answered. The patient agreed with the plan and demonstrated an understanding of the instructions.   The patient was advised to call back or seek an in-person evaluation if the symptoms worsen or if the condition fails to improve as anticipated.  The above assessment and management plan was discussed with the patient. The patient verbalized understanding of and has agreed to the management plan. Patient is aware to call the clinic if symptoms persist or worsen. Patient is aware when to return to the clinic for a follow-up visit. Patient educated on when it is appropriate to go to the emergency department.    I provided 12 minutes of non-face-to-face time during this encounter.    Nils PyleJoshua A Dettinger, MD

## 2019-01-07 DIAGNOSIS — E103519 Type 1 diabetes mellitus with proliferative diabetic retinopathy with macular edema, unspecified eye: Secondary | ICD-10-CM | POA: Diagnosis not present

## 2019-01-07 DIAGNOSIS — E1065 Type 1 diabetes mellitus with hyperglycemia: Secondary | ICD-10-CM | POA: Diagnosis not present

## 2019-01-07 DIAGNOSIS — Z9641 Presence of insulin pump (external) (internal): Secondary | ICD-10-CM | POA: Diagnosis not present

## 2019-02-05 ENCOUNTER — Ambulatory Visit (INDEPENDENT_AMBULATORY_CARE_PROVIDER_SITE_OTHER): Payer: Medicare Other | Admitting: Family Medicine

## 2019-02-05 ENCOUNTER — Encounter: Payer: Self-pay | Admitting: Family Medicine

## 2019-02-05 DIAGNOSIS — M19011 Primary osteoarthritis, right shoulder: Secondary | ICD-10-CM | POA: Diagnosis not present

## 2019-02-05 DIAGNOSIS — M19012 Primary osteoarthritis, left shoulder: Secondary | ICD-10-CM

## 2019-02-05 DIAGNOSIS — F411 Generalized anxiety disorder: Secondary | ICD-10-CM

## 2019-02-05 MED ORDER — ACETAMINOPHEN-CODEINE #3 300-30 MG PO TABS: ORAL_TABLET | ORAL | 0 refills | Status: DC

## 2019-02-05 MED ORDER — OXAZEPAM 15 MG PO CAPS: ORAL_CAPSULE | ORAL | 3 refills | Status: DC

## 2019-02-05 NOTE — Progress Notes (Signed)
Virtual Visit via telephone Note  I connected with Carol Jensen on 02/05/19 at 1115 by telephone and verified that I am speaking with the correct person using two identifiers. Carol Jensen is currently located at home and mother are currently with her during visit. The provider, Fransisca Kaufmann Willim Turnage, MD is located in their office at time of visit.  Call ended at 1128  I discussed the limitations, risks, security and privacy concerns of performing an evaluation and management service by telephone and the availability of in person appointments. I also discussed with the patient that there may be a patient responsible charge related to this service. The patient expressed understanding and agreed to proceed.   History and Present Illness: Pain assessment: Cause of pain-bilateral shoulder arthritis Pain location-bilateral shoulders Pain on scale of 1-10- 4 Frequency-most days but not every day, worse with weather changes What increases pain-movement or weather or moving in the wrong direction with the shoulders or arms What makes pain Better-the medication and icy hot Effects on ADL -does not limit her yet and has good range of motion Any change in general medical condition-none  Current opioids rx-Tylenol 3 twice daily as needed and oxazepam 15 mg every 8 hours as needed # meds rx-40 of Tylenol 3 and 90 of oxazepam Effectiveness of current meds-working well except for the arthritis sometimes flares up more often than she has medication and wonders if she can get a few more Adverse reactions form pain meds-none Morphine equivalent- 4.5-9  Pill count performed-No Last drug screen -N/A ( high risk q60m, moderate risk q3m, low risk yearly ) Urine drug screen today- No Was the [facility] reviewed-yes  If yes were their any concerning findings? -None  No flowsheet data found.   Pain contract signed on: N/A, was virtual so we will get on next visit.  No diagnosis found.  Outpatient Encounter  Medications as of 02/05/2019  Medication Sig  . acetaminophen (TYLENOL) 500 MG tablet Take 500 mg by mouth every 6 (six) hours as needed.  Marland Kitchen acetaminophen-codeine (TYLENOL #3) 300-30 MG tablet every 4 (four) hours as needed.  . clotrimazole-betamethasone (LOTRISONE) cream Apply topically 2 (two) times daily.  . DULoxetine (CYMBALTA) 30 MG capsule Take 1 capsule (30 mg total) by mouth daily.  Marland Kitchen glucose blood (CONTOUR NEXT TEST) test strip Test BS 3 times daily  . hydrochlorothiazide (HYDRODIURIL) 25 MG tablet Take 25 mg by mouth daily.  . insulin aspart (NOVOLOG) 100 UNIT/ML injection Use approximately 80 units daily via Insulin Pump  DX E10.9 and Z96.41  . lisinopril (PRINIVIL,ZESTRIL) 10 MG tablet Take 1 tablet (10 mg total) by mouth daily.  Marland Kitchen omeprazole (PRILOSEC) 20 MG capsule Take 1 capsule (20 mg total) by mouth daily.  Marland Kitchen oxazepam (SERAX) 15 MG capsule Take 1 Capsule by mouth every 8 hours as needed FOR ANXIETY  . TRUEPLUS LANCETS 33G MISC Test blood sugars 3 times daily  . Vitamin D, Ergocalciferol, (DRISDOL) 50000 units CAPS capsule Take 50,000 Units by mouth every 7 (seven) days.    No facility-administered encounter medications on file as of 02/05/2019.     Review of Systems  Constitutional: Negative for chills and fever.  Eyes: Negative for visual disturbance.  Respiratory: Negative for chest tightness and shortness of breath.   Cardiovascular: Negative for chest pain and leg swelling.  Genitourinary: Negative for difficulty urinating and dysuria.  Musculoskeletal: Positive for arthralgias. Negative for back pain and gait problem.  Skin: Negative for rash.  Neurological:  Negative for dizziness, light-headedness and headaches.  Psychiatric/Behavioral: Positive for dysphoric mood. Negative for agitation, behavioral problems, self-injury, sleep disturbance and suicidal ideas. The patient is nervous/anxious.   All other systems reviewed and are negative.   Observations/Objective:  Patient sounds comfortable and in no acute distress  Assessment and Plan: Problem List Items Addressed This Visit      Other   GAD (generalized anxiety disorder) - Primary   Relevant Medications   oxazepam (SERAX) 15 MG capsule    Other Visit Diagnoses    Bilateral shoulder region arthritis       Relevant Medications   acetaminophen-codeine (TYLENOL #3) 300-30 MG tablet       Follow Up Instructions:  Follow up in 3 months  Continue meds for now, increase Tylenol with codeine by 10 to give her a few more when she has extra need for pain.  She does not use them every day.   I discussed the assessment and treatment plan with the patient. The patient was provided an opportunity to ask questions and all were answered. The patient agreed with the plan and demonstrated an understanding of the instructions.   The patient was advised to call back or seek an in-person evaluation if the symptoms worsen or if the condition fails to improve as anticipated.  The above assessment and management plan was discussed with the patient. The patient verbalized understanding of and has agreed to the management plan. Patient is aware to call the clinic if symptoms persist or worsen. Patient is aware when to return to the clinic for a follow-up visit. Patient educated on when it is appropriate to go to the emergency department.    I provided 13 minutes of non-face-to-face time during this encounter.    Nils PyleJoshua A Kahleb Mcclane, MD

## 2019-03-23 ENCOUNTER — Other Ambulatory Visit: Payer: Self-pay

## 2019-03-23 ENCOUNTER — Ambulatory Visit (INDEPENDENT_AMBULATORY_CARE_PROVIDER_SITE_OTHER): Payer: Medicare Other

## 2019-03-23 DIAGNOSIS — Z23 Encounter for immunization: Secondary | ICD-10-CM | POA: Diagnosis not present

## 2019-04-08 DIAGNOSIS — Z9641 Presence of insulin pump (external) (internal): Secondary | ICD-10-CM | POA: Diagnosis not present

## 2019-04-08 DIAGNOSIS — E1065 Type 1 diabetes mellitus with hyperglycemia: Secondary | ICD-10-CM | POA: Diagnosis not present

## 2019-04-08 DIAGNOSIS — E103519 Type 1 diabetes mellitus with proliferative diabetic retinopathy with macular edema, unspecified eye: Secondary | ICD-10-CM | POA: Diagnosis not present

## 2019-05-11 ENCOUNTER — Ambulatory Visit (INDEPENDENT_AMBULATORY_CARE_PROVIDER_SITE_OTHER): Payer: Medicare Other | Admitting: Family Medicine

## 2019-05-11 ENCOUNTER — Encounter: Payer: Self-pay | Admitting: Family Medicine

## 2019-05-11 DIAGNOSIS — M19012 Primary osteoarthritis, left shoulder: Secondary | ICD-10-CM

## 2019-05-11 DIAGNOSIS — M19011 Primary osteoarthritis, right shoulder: Secondary | ICD-10-CM

## 2019-05-11 MED ORDER — ACETAMINOPHEN-CODEINE #3 300-30 MG PO TABS: ORAL_TABLET | ORAL | 0 refills | Status: DC

## 2019-05-11 NOTE — Progress Notes (Signed)
Virtual Visit via telephone Note  I connected with Carol Jensen on 05/11/19 at 1050 by telephone and verified that I am speaking with the correct person using two identifiers. Carol Jensen is currently located at home and mother are currently with her during visit. The provider, Fransisca Kaufmann Dettinger, MD is located in their office at time of visit.  Call ended at 1102  I discussed the limitations, risks, security and privacy concerns of performing an evaluation and management service by telephone and the availability of in person appointments. I also discussed with the patient that there may be a patient responsible charge related to this service. The patient expressed understanding and agreed to proceed.   History and Present Illness: Chronic shoulder and back pain Pain assessment: Cause of pain- arthritis and shoulder Pain location-bilateral shoulders and lower back Pain on scale of 1-10-3 Frequency-some days, a couple times a week What increases pain-certain movements or actions. What makes pain Better-Tylenol with codeine helps along with stretching Effects on ADL -no major effects or limitations because it does not occur that frequently Any change in general medical condition-none  Current opioids rx-Tylenol 3 every 4 hours as needed # meds rx-40, usually last a few months Effectiveness of current meds-works very well Adverse reactions form pain meds-none Morphine equivalent- 4.5  Pill count performed-No Last drug screen -N/A ( high risk q19m, moderate risk q21m, low risk yearly ) Urine drug screen today- No, was virtual appointment Was the [facility] reviewed-yes  If yes were their any concerning findings? -None  No flowsheet data found.   Pain contract signed on: N/A  No diagnosis found.  Outpatient Encounter Medications as of 05/11/2019  Medication Sig  . acetaminophen (TYLENOL) 500 MG tablet Take 500 mg by mouth every 6 (six) hours as needed.  Marland Kitchen acetaminophen-codeine  (TYLENOL #3) 300-30 MG tablet every 4 (four) hours as needed.  . clotrimazole-betamethasone (LOTRISONE) cream Apply topically 2 (two) times daily.  . DULoxetine (CYMBALTA) 30 MG capsule Take 1 capsule (30 mg total) by mouth daily.  Marland Kitchen glucose blood (CONTOUR NEXT TEST) test strip Test BS 3 times daily  . hydrochlorothiazide (HYDRODIURIL) 25 MG tablet Take 25 mg by mouth daily.  . insulin aspart (NOVOLOG) 100 UNIT/ML injection Use approximately 80 units daily via Insulin Pump  DX E10.9 and Z96.41  . lisinopril (PRINIVIL,ZESTRIL) 10 MG tablet Take 1 tablet (10 mg total) by mouth daily.  Marland Kitchen omeprazole (PRILOSEC) 20 MG capsule Take 1 capsule (20 mg total) by mouth daily.  Marland Kitchen oxazepam (SERAX) 15 MG capsule Take 1 Capsule by mouth every 8 hours as needed FOR ANXIETY  . TRUEPLUS LANCETS 33G MISC Test blood sugars 3 times daily  . Vitamin D, Ergocalciferol, (DRISDOL) 50000 units CAPS capsule Take 50,000 Units by mouth every 7 (seven) days.    No facility-administered encounter medications on file as of 05/11/2019.     Review of Systems  Constitutional: Negative for chills and fever.  Eyes: Negative for visual disturbance.  Respiratory: Negative for chest tightness and shortness of breath.   Cardiovascular: Negative for chest pain and leg swelling.  Musculoskeletal: Positive for arthralgias and back pain. Negative for gait problem.  All other systems reviewed and are negative.   Observations/Objective: Patient sounds comfortable and in no acute distress  Assessment and Plan: Problem List Items Addressed This Visit    None    Visit Diagnoses    Bilateral shoulder region arthritis       Relevant Medications  acetaminophen-codeine (TYLENOL #3) 300-30 MG tablet       Follow Up Instructions: Follow up in 3 months    I discussed the assessment and treatment plan with the patient. The patient was provided an opportunity to ask questions and all were answered. The patient agreed with the plan  and demonstrated an understanding of the instructions.   The patient was advised to call back or seek an in-person evaluation if the symptoms worsen or if the condition fails to improve as anticipated.  The above assessment and management plan was discussed with the patient. The patient verbalized understanding of and has agreed to the management plan. Patient is aware to call the clinic if symptoms persist or worsen. Patient is aware when to return to the clinic for a follow-up visit. Patient educated on when it is appropriate to go to the emergency department.    I provided 12 minutes of non-face-to-face time during this encounter.    Nils Pyle, MD

## 2019-05-19 DIAGNOSIS — H18529 Epithelial (juvenile) corneal dystrophy, unspecified eye: Secondary | ICD-10-CM | POA: Diagnosis not present

## 2019-05-19 DIAGNOSIS — H25811 Combined forms of age-related cataract, right eye: Secondary | ICD-10-CM | POA: Diagnosis not present

## 2019-05-19 DIAGNOSIS — E113593 Type 2 diabetes mellitus with proliferative diabetic retinopathy without macular edema, bilateral: Secondary | ICD-10-CM | POA: Diagnosis not present

## 2019-05-19 DIAGNOSIS — Z961 Presence of intraocular lens: Secondary | ICD-10-CM | POA: Diagnosis not present

## 2019-05-19 DIAGNOSIS — H04123 Dry eye syndrome of bilateral lacrimal glands: Secondary | ICD-10-CM | POA: Diagnosis not present

## 2019-05-19 DIAGNOSIS — H52223 Regular astigmatism, bilateral: Secondary | ICD-10-CM | POA: Diagnosis not present

## 2019-05-19 DIAGNOSIS — Z9889 Other specified postprocedural states: Secondary | ICD-10-CM | POA: Diagnosis not present

## 2019-05-19 DIAGNOSIS — Z9842 Cataract extraction status, left eye: Secondary | ICD-10-CM | POA: Diagnosis not present

## 2019-07-13 DIAGNOSIS — E103519 Type 1 diabetes mellitus with proliferative diabetic retinopathy with macular edema, unspecified eye: Secondary | ICD-10-CM | POA: Diagnosis not present

## 2019-07-13 DIAGNOSIS — E10649 Type 1 diabetes mellitus with hypoglycemia without coma: Secondary | ICD-10-CM | POA: Diagnosis not present

## 2019-07-13 DIAGNOSIS — E1065 Type 1 diabetes mellitus with hyperglycemia: Secondary | ICD-10-CM | POA: Diagnosis not present

## 2019-07-13 DIAGNOSIS — E559 Vitamin D deficiency, unspecified: Secondary | ICD-10-CM | POA: Diagnosis not present

## 2019-08-10 ENCOUNTER — Ambulatory Visit (INDEPENDENT_AMBULATORY_CARE_PROVIDER_SITE_OTHER): Payer: Medicare Other | Admitting: Family Medicine

## 2019-08-10 ENCOUNTER — Encounter: Payer: Self-pay | Admitting: Family Medicine

## 2019-08-10 DIAGNOSIS — F411 Generalized anxiety disorder: Secondary | ICD-10-CM | POA: Diagnosis not present

## 2019-08-10 DIAGNOSIS — M19012 Primary osteoarthritis, left shoulder: Secondary | ICD-10-CM | POA: Diagnosis not present

## 2019-08-10 DIAGNOSIS — M19011 Primary osteoarthritis, right shoulder: Secondary | ICD-10-CM

## 2019-08-10 MED ORDER — ACETAMINOPHEN-CODEINE #3 300-30 MG PO TABS: ORAL_TABLET | ORAL | 0 refills | Status: DC

## 2019-08-10 MED ORDER — DULOXETINE HCL 30 MG PO CPEP: 30.0000 mg | ORAL_CAPSULE | Freq: Every day | ORAL | 3 refills | Status: DC

## 2019-08-10 MED ORDER — OXAZEPAM 15 MG PO CAPS: ORAL_CAPSULE | ORAL | 2 refills | Status: DC

## 2019-08-10 MED ORDER — OMEPRAZOLE 20 MG PO CPDR: 20.0000 mg | DELAYED_RELEASE_CAPSULE | Freq: Every day | ORAL | 3 refills | Status: DC

## 2019-08-10 NOTE — Patient Instructions (Signed)
COVID-19 Vaccine Information can be found at: [email or web address] For questions related to vaccine distribution or appointments, please email [email or web address] or call [phone]    

## 2019-08-10 NOTE — Progress Notes (Signed)
Virtual Visit via telephone Note  I connected with Carol Jensen on 08/10/19 at 1320 by telephone and verified that I am speaking with the correct person using two identifiers. Carol Jensen is currently located at home and mother are currently with her during visit. The provider, Fransisca Kaufmann Jalexia Lalli, MD is located in their office at time of visit.  Call ended at 1330  I discussed the limitations, risks, security and privacy concerns of performing an evaluation and management service by telephone and the availability of in person appointments. I also discussed with the patient that there may be a patient responsible charge related to this service. The patient expressed understanding and agreed to proceed.   History and Present Illness: Anxiety Patient is calling in for cymbalta and serax refills and is doing very well. Current rx- serax 15 mg tid prn # meds rx- 90 Effectiveness of current meds-works well Adverse reactions form meds-none  Pill count performed-No Last drug screen - n/a ( high risk q88m, moderate risk q76m, low risk yearly ) Urine drug screen today- No, virtual visit Was the [place] reviewed- yes  If yes were their any concerning findings? - none  No flowsheet data found.   Controlled substance contract signed on: n/a  Pain assessment: Cause of pain- low back pain Pain location- lower back Pain on scale of 1-10- 5 Frequency- infrequently What increases pain-standing What makes pain Better-tylenol 3 Effects on ADL - none Any change in general medical condition-none  Current opioids rx-Tylenol 3 every 4 hours as needed # meds rx-40 tablets for a 46-month cycle Effectiveness of current meds-works well, uses infrequently Adverse reactions form pain meds-none Morphine equivalent-N/A, codeine  Pill count performed-No Last drug screen -N/A ( high risk q2m, moderate risk q28m, low risk yearly ) Urine drug screen today- No, will do at next visit is virtual today Was  the [place] reviewed-yes  If yes were their any concerning findings? -None  No flowsheet data found.   Pain contract signed on: n/a  1. GAD (generalized anxiety disorder)   2. Bilateral shoulder region arthritis     Outpatient Encounter Medications as of 08/10/2019  Medication Sig  . acetaminophen (TYLENOL) 500 MG tablet Take 500 mg by mouth every 6 (six) hours as needed.  Marland Kitchen acetaminophen-codeine (TYLENOL #3) 300-30 MG tablet every 4 (four) hours as needed.  . clotrimazole-betamethasone (LOTRISONE) cream Apply topically 2 (two) times daily.  . DULoxetine (CYMBALTA) 30 MG capsule Take 1 capsule (30 mg total) by mouth daily.  Marland Kitchen glucose blood (CONTOUR NEXT TEST) test strip Test BS 3 times daily  . hydrochlorothiazide (HYDRODIURIL) 25 MG tablet Take 25 mg by mouth daily.  . insulin aspart (NOVOLOG) 100 UNIT/ML injection Use approximately 80 units daily via Insulin Pump  DX E10.9 and Z96.41  . lisinopril (PRINIVIL,ZESTRIL) 10 MG tablet Take 1 tablet (10 mg total) by mouth daily.  Marland Kitchen omeprazole (PRILOSEC) 20 MG capsule Take 1 capsule (20 mg total) by mouth daily.  Marland Kitchen oxazepam (SERAX) 15 MG capsule Take 1 Capsule by mouth every 8 hours as needed FOR ANXIETY  . TRUEPLUS LANCETS 33G MISC Test blood sugars 3 times daily  . Vitamin D, Ergocalciferol, (DRISDOL) 50000 units CAPS capsule Take 50,000 Units by mouth every 7 (seven) days.   . [DISCONTINUED] acetaminophen-codeine (TYLENOL #3) 300-30 MG tablet every 4 (four) hours as needed.  . [DISCONTINUED] DULoxetine (CYMBALTA) 30 MG capsule Take 1 capsule (30 mg total) by mouth daily.  . [DISCONTINUED] omeprazole (PRILOSEC)  20 MG capsule Take 1 capsule (20 mg total) by mouth daily.  . [DISCONTINUED] oxazepam (SERAX) 15 MG capsule Take 1 Capsule by mouth every 8 hours as needed FOR ANXIETY   No facility-administered encounter medications on file as of 08/10/2019.    Review of Systems  Constitutional: Negative for chills and fever.  Eyes: Negative for  visual disturbance.  Respiratory: Negative for chest tightness and shortness of breath.   Cardiovascular: Negative for chest pain and leg swelling.  Musculoskeletal: Positive for arthralgias. Negative for back pain and gait problem.  Skin: Negative for rash.  Neurological: Negative for light-headedness and headaches.  Psychiatric/Behavioral: Negative for agitation, behavioral problems, self-injury, sleep disturbance and suicidal ideas. The patient is nervous/anxious.   All other systems reviewed and are negative.   Observations/Objective: Patient sounds comfortable and in no acute distress  Assessment and Plan: Problem List Items Addressed This Visit      Other   GAD (generalized anxiety disorder)   Relevant Medications   oxazepam (SERAX) 15 MG capsule   DULoxetine (CYMBALTA) 30 MG capsule    Other Visit Diagnoses    Bilateral shoulder region arthritis       Relevant Medications   acetaminophen-codeine (TYLENOL #3) 300-30 MG tablet      Continue current medication, warned on mixing the Tylenol 3 which she only gets 1 prescription every 3 or 4 months and does not use even on a weekly basis sometimes.  Continue Cymbalta and oxazepam Follow up plan: Return in about 3 months (around 11/07/2019), or if symptoms worsen or fail to improve, for pain recheck and anxiety.     I discussed the assessment and treatment plan with the patient. The patient was provided an opportunity to ask questions and all were answered. The patient agreed with the plan and demonstrated an understanding of the instructions.   The patient was advised to call back or seek an in-person evaluation if the symptoms worsen or if the condition fails to improve as anticipated.  The above assessment and management plan was discussed with the patient. The patient verbalized understanding of and has agreed to the management plan. Patient is aware to call the clinic if symptoms persist or worsen. Patient is aware when to  return to the clinic for a follow-up visit. Patient educated on when it is appropriate to go to the emergency department.    I provided 10 minutes of non-face-to-face time during this encounter.    Nils Pyle, MD

## 2019-08-27 DIAGNOSIS — Z23 Encounter for immunization: Secondary | ICD-10-CM | POA: Diagnosis not present

## 2019-09-22 ENCOUNTER — Other Ambulatory Visit: Payer: Self-pay | Admitting: Family Medicine

## 2019-10-12 DIAGNOSIS — E10649 Type 1 diabetes mellitus with hypoglycemia without coma: Secondary | ICD-10-CM | POA: Diagnosis not present

## 2019-10-12 DIAGNOSIS — E103519 Type 1 diabetes mellitus with proliferative diabetic retinopathy with macular edema, unspecified eye: Secondary | ICD-10-CM | POA: Diagnosis not present

## 2019-10-12 DIAGNOSIS — E1065 Type 1 diabetes mellitus with hyperglycemia: Secondary | ICD-10-CM | POA: Diagnosis not present

## 2019-10-12 DIAGNOSIS — Z9641 Presence of insulin pump (external) (internal): Secondary | ICD-10-CM | POA: Diagnosis not present

## 2019-11-11 ENCOUNTER — Ambulatory Visit (INDEPENDENT_AMBULATORY_CARE_PROVIDER_SITE_OTHER): Payer: Medicare Other | Admitting: Family Medicine

## 2019-11-11 ENCOUNTER — Encounter: Payer: Self-pay | Admitting: Family Medicine

## 2019-11-11 ENCOUNTER — Other Ambulatory Visit: Payer: Self-pay

## 2019-11-11 VITALS — BP 111/65 | HR 83 | Temp 98.1°F | Ht 64.0 in | Wt 236.4 lb

## 2019-11-11 DIAGNOSIS — E113599 Type 2 diabetes mellitus with proliferative diabetic retinopathy without macular edema, unspecified eye: Secondary | ICD-10-CM

## 2019-11-11 DIAGNOSIS — I152 Hypertension secondary to endocrine disorders: Secondary | ICD-10-CM

## 2019-11-11 DIAGNOSIS — Z23 Encounter for immunization: Secondary | ICD-10-CM

## 2019-11-11 DIAGNOSIS — Z79891 Long term (current) use of opiate analgesic: Secondary | ICD-10-CM | POA: Diagnosis not present

## 2019-11-11 DIAGNOSIS — M19011 Primary osteoarthritis, right shoulder: Secondary | ICD-10-CM | POA: Diagnosis not present

## 2019-11-11 DIAGNOSIS — M19012 Primary osteoarthritis, left shoulder: Secondary | ICD-10-CM | POA: Diagnosis not present

## 2019-11-11 DIAGNOSIS — E1159 Type 2 diabetes mellitus with other circulatory complications: Secondary | ICD-10-CM | POA: Diagnosis not present

## 2019-11-11 DIAGNOSIS — F411 Generalized anxiety disorder: Secondary | ICD-10-CM

## 2019-11-11 DIAGNOSIS — E1022 Type 1 diabetes mellitus with diabetic chronic kidney disease: Secondary | ICD-10-CM

## 2019-11-11 DIAGNOSIS — E103599 Type 1 diabetes mellitus with proliferative diabetic retinopathy without macular edema, unspecified eye: Secondary | ICD-10-CM | POA: Diagnosis not present

## 2019-11-11 DIAGNOSIS — N183 Chronic kidney disease, stage 3 unspecified: Secondary | ICD-10-CM

## 2019-11-11 DIAGNOSIS — I10 Essential (primary) hypertension: Secondary | ICD-10-CM

## 2019-11-11 MED ORDER — ACETAMINOPHEN-CODEINE #3 300-30 MG PO TABS: ORAL_TABLET | ORAL | 0 refills | Status: DC

## 2019-11-11 MED ORDER — CELECOXIB 100 MG PO CAPS
100.0000 mg | ORAL_CAPSULE | Freq: Two times a day (BID) | ORAL | 1 refills | Status: DC
Start: 2019-11-11 — End: 2020-02-15

## 2019-11-11 MED ORDER — OXAZEPAM 15 MG PO CAPS: 15.0000 mg | ORAL_CAPSULE | Freq: Every day | ORAL | 2 refills | Status: DC | PRN

## 2019-11-11 NOTE — Progress Notes (Signed)
BP 111/65   Pulse 83   Temp 98.1 F (36.7 C) (Temporal)   Ht 5\' 4"  (1.626 m)   Wt 236 lb 6 oz (107.2 kg)   LMP 03/24/2013   BMI 40.57 kg/m    Subjective:   Patient ID: 05/24/2013, female    DOB: Jun 17, 1961, 59 y.o.   MRN: [id number]  HPI: Carol Jensen is a 59 y.o. female presenting on 11/11/2019 for Medical Management of Chronic Issues   HPI Anxiety and pain Patient uses for arthritis Tylenol 3 to help with her spine and knees and hips.  Patient takes oxazepam to help with anxiety and sleep Pain assessment: Cause of pain-arthritis Pain location-spine and knees and hips Pain on scale of 1-10- 2 Frequency-infrequently, only uses a few Tylenol codeine every now and then, 1 prescription of 40 tablets usually lasts 3 months What increases pain-overworking What makes pain Better-resting in Tylenol 3 Effects on ADL -none currently Any change in general medical condition-none  Current opioids rx-Tylenol 3 every 4 hours as needed, oxazepam every 8 hours as needed # meds rx-40 tablets of Tylenol 3, 90 tablets of oxazepam Effectiveness of current meds-works well Adverse reactions form pain meds-none Morphine equivalent-5  Pill count performed-No Last drug screen -N/A ( high risk q55m, moderate risk q21m, low risk yearly ) Urine drug screen today- Yes Was the [facility] reviewed-yes  If yes were their any concerning findings? -Patient has not been feeling every month and it looks like his been 3 months since last refill   Overdose risk: 260 No flowsheet data found.   Pain contract signed on: Today  Patient sees her endocrinologist for her diabetes and hypertension and blood work, she did get a diabetic eye exam in 05/2019  Relevant past medical, surgical, family and social history reviewed and updated as indicated. Interim medical history since our last visit reviewed. Allergies and medications reviewed and updated.  Review of Systems  Constitutional: Negative for chills and  fever.  HENT: Negative for congestion, ear discharge and ear pain.   Eyes: Negative for redness and visual disturbance.  Respiratory: Negative for chest tightness and shortness of breath.   Cardiovascular: Negative for chest pain and leg swelling.  Genitourinary: Negative for difficulty urinating and dysuria.  Musculoskeletal: Positive for arthralgias. Negative for back pain and gait problem.  Skin: Negative for rash.  Neurological: Negative for light-headedness and headaches.  Psychiatric/Behavioral: Positive for dysphoric mood and sleep disturbance. Negative for agitation, behavioral problems, self-injury and suicidal ideas. The patient is nervous/anxious.   All other systems reviewed and are negative.   Per HPI unless specifically indicated above   Allergies as of 11/11/2019      Reactions   Aspirin    States is not suppose to take due to eye hemorrhages.       Medication List       Accurate as of Nov 11, 2019  3:27 PM. If you have any questions, ask your nurse or doctor.        acetaminophen 500 MG tablet Commonly known as: TYLENOL Take 500 mg by mouth every 6 (six) hours as needed.   acetaminophen-codeine 300-30 MG tablet Commonly known as: TYLENOL #3 every 4 (four) hours as needed.   celecoxib 100 MG capsule Commonly known as: CeleBREX Take 1 capsule (100 mg total) by mouth 2 (two) times daily. Started by: Nov 13, 2019, MD   clotrimazole-betamethasone cream Commonly known as: LOTRISONE Apply topically 2 (two) times daily.  DULoxetine 30 MG capsule Commonly known as: CYMBALTA Take 1 capsule (30 mg total) by mouth daily.   glucose blood test strip Commonly known as: Contour Next Test Test BS 3 times daily   hydrochlorothiazide 25 MG tablet Commonly known as: HYDRODIURIL Take 25 mg by mouth daily.   insulin aspart 100 UNIT/ML injection Commonly known as: novoLOG Use approximately 80 units daily via Insulin Pump  DX E10.9 and Z96.41   lisinopril  10 MG tablet Commonly known as: ZESTRIL Take 1 tablet (10 mg total) by mouth daily.   omeprazole 20 MG capsule Commonly known as: PRILOSEC Take 1 capsule (20 mg total) by mouth daily.   oxazepam 15 MG capsule Commonly known as: SERAX Take 1 capsule (15 mg total) by mouth daily as needed for sleep. Take 1 Capsule by mouth every 8 hours as needed FOR ANXIETY What changed:   how much to take  how to take this  when to take this  reasons to take this Changed by: Fransisca Kaufmann Fatiha Guzy, MD   TRUEplus Lancets 33G Misc Test blood sugars 3 times daily   VITAMIN C PO Take by mouth.   Vitamin D (Ergocalciferol) 1.25 MG (50000 UNIT) Caps capsule Commonly known as: DRISDOL Take 50,000 Units by mouth every 7 (seven) days.   vitamin E 180 MG (400 UNITS) capsule Take by mouth.        Objective:   BP 111/65   Pulse 83   Temp 98.1 F (36.7 C) (Temporal)   Ht 5\' 4"  (1.626 m)   Wt 236 lb 6 oz (107.2 kg)   LMP 03/24/2013   BMI 40.57 kg/m   Wt Readings from Last 3 Encounters:  11/11/19 236 lb 6 oz (107.2 kg)  08/04/18 223 lb 6.4 oz (101.3 kg)  04/30/18 214 lb 9.6 oz (97.3 kg)    Physical Exam Vitals and nursing note reviewed.  Constitutional:      General: She is not in acute distress.    Appearance: She is well-developed. She is not diaphoretic.  Eyes:     Conjunctiva/sclera: Conjunctivae normal.  Cardiovascular:     Rate and Rhythm: Normal rate and regular rhythm.     Heart sounds: Normal heart sounds. No murmur.  Pulmonary:     Effort: Pulmonary effort is normal. No respiratory distress.     Breath sounds: Normal breath sounds. No wheezing.  Musculoskeletal:        General: No tenderness. Normal range of motion.  Skin:    General: Skin is warm and dry.     Findings: No rash.  Neurological:     Mental Status: She is alert and oriented to person, place, and time.     Coordination: Coordination normal.  Psychiatric:        Behavior: Behavior normal.        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  Follow up plan: Return in about 3 months (around 02/11/2020), or if symptoms worsen or fail to improve, for Anxiety hypertension recheck.  Counseling provided for all of the vaccine components Orders Placed This Encounter  Procedures  . Tdap vaccine greater than or equal to 7yo IM  . ToxASSURE Select 13 (MW), Urine    Arville Care, MD [facility] 11/11/2019, 3:27 PM

## 2019-11-12 ENCOUNTER — Telehealth: Payer: Self-pay | Admitting: *Deleted

## 2019-11-12 DIAGNOSIS — F411 Generalized anxiety disorder: Secondary | ICD-10-CM

## 2019-11-12 MED ORDER — OXAZEPAM 15 MG PO CAPS: 15.0000 mg | ORAL_CAPSULE | Freq: Every day | ORAL | 2 refills | Status: DC | PRN

## 2019-11-12 NOTE — Telephone Encounter (Signed)
I fixed the prescription and resent it

## 2019-11-12 NOTE — Telephone Encounter (Signed)
Fax from [facility] pharmacy Oxazepam 15 mg Sig: Take 1 capsule (15 mg total) by mouth daily as needed for sleep. Take 1 Capsule by mouth every 8 hours as needed FOR ANXIETY  Please verify directions

## 2019-11-14 LAB — TOXASSURE SELECT 13 (MW), URINE

## 2020-01-14 DIAGNOSIS — Z6841 Body Mass Index (BMI) 40.0 and over, adult: Secondary | ICD-10-CM | POA: Diagnosis not present

## 2020-01-14 DIAGNOSIS — Z9641 Presence of insulin pump (external) (internal): Secondary | ICD-10-CM | POA: Diagnosis not present

## 2020-01-14 DIAGNOSIS — E103519 Type 1 diabetes mellitus with proliferative diabetic retinopathy with macular edema, unspecified eye: Secondary | ICD-10-CM | POA: Diagnosis not present

## 2020-01-14 DIAGNOSIS — E1065 Type 1 diabetes mellitus with hyperglycemia: Secondary | ICD-10-CM | POA: Diagnosis not present

## 2020-01-14 DIAGNOSIS — E10649 Type 1 diabetes mellitus with hypoglycemia without coma: Secondary | ICD-10-CM | POA: Diagnosis not present

## 2020-02-15 ENCOUNTER — Other Ambulatory Visit: Payer: Self-pay

## 2020-02-15 ENCOUNTER — Ambulatory Visit (INDEPENDENT_AMBULATORY_CARE_PROVIDER_SITE_OTHER): Payer: Medicare Other | Admitting: Family Medicine

## 2020-02-15 ENCOUNTER — Encounter: Payer: Self-pay | Admitting: Family Medicine

## 2020-02-15 VITALS — BP 114/68 | HR 95 | Temp 98.0°F | Ht 64.0 in | Wt 235.5 lb

## 2020-02-15 DIAGNOSIS — E1159 Type 2 diabetes mellitus with other circulatory complications: Secondary | ICD-10-CM

## 2020-02-15 DIAGNOSIS — M19011 Primary osteoarthritis, right shoulder: Secondary | ICD-10-CM | POA: Diagnosis not present

## 2020-02-15 DIAGNOSIS — R011 Cardiac murmur, unspecified: Secondary | ICD-10-CM

## 2020-02-15 DIAGNOSIS — E1022 Type 1 diabetes mellitus with diabetic chronic kidney disease: Secondary | ICD-10-CM

## 2020-02-15 DIAGNOSIS — K219 Gastro-esophageal reflux disease without esophagitis: Secondary | ICD-10-CM | POA: Diagnosis not present

## 2020-02-15 DIAGNOSIS — M19012 Primary osteoarthritis, left shoulder: Secondary | ICD-10-CM

## 2020-02-15 DIAGNOSIS — N183 Chronic kidney disease, stage 3 unspecified: Secondary | ICD-10-CM | POA: Diagnosis not present

## 2020-02-15 DIAGNOSIS — I10 Essential (primary) hypertension: Secondary | ICD-10-CM | POA: Diagnosis not present

## 2020-02-15 DIAGNOSIS — I152 Hypertension secondary to endocrine disorders: Secondary | ICD-10-CM

## 2020-02-15 LAB — BMP8+EGFR
BUN/Creatinine Ratio: 20 (ref 9–23)
BUN: 23 mg/dL (ref 6–24)
CO2: 24 mmol/L (ref 20–29)
Calcium: 9.4 mg/dL (ref 8.7–10.2)
Chloride: 102 mmol/L (ref 96–106)
Creatinine, Ser: 1.17 mg/dL — ABNORMAL HIGH (ref 0.57–1.00)
GFR calc Af Amer: 60 mL/min/{1.73_m2} (ref >59)
GFR calc non Af Amer: 52 mL/min/{1.73_m2} — ABNORMAL LOW (ref >59)
Glucose: 193 mg/dL — ABNORMAL HIGH (ref 65–99)
Potassium: 5.5 mmol/L — ABNORMAL HIGH (ref 3.5–5.2)
Sodium: 137 mmol/L (ref 134–144)

## 2020-02-15 MED ORDER — FAMOTIDINE 20 MG PO TABS
20.0000 mg | ORAL_TABLET | Freq: Two times a day (BID) | ORAL | 3 refills | Status: DC
Start: 2020-02-15 — End: 2021-02-22

## 2020-02-15 MED ORDER — ACETAMINOPHEN-CODEINE #3 300-30 MG PO TABS: ORAL_TABLET | ORAL | 0 refills | Status: DC

## 2020-02-15 NOTE — Progress Notes (Signed)
BP 114/68   Pulse 95   Temp 98 F (36.7 C)   Ht 5' 4" (1.626 m)   Wt 235 lb 8 oz (106.8 kg)   LMP 03/24/2013   SpO2 97%   BMI 40.42 kg/m    Subjective:   [name], female    DOB: 05/31/1961, 59 y.o.   MRN: [id number]  HPI: Carol Jensen is a 59 y.o. female presenting on 02/15/2020 for Medical Management of Chronic Issues and Hypertension   HPI Pain assessment: Cause of pain-chronic shoulder arthritis Pain location-bilateral shoulders Pain on scale of 1-10- 4 Frequency-not every day, uses the Tylenol with codeine as needed What increases pain-excessive movements and heat and weather changes What makes pain Better-resting and Tylenol codeine, she used to take Celebrex which helped but cannot now because her kidney function Effects on ADL -minimal limitations Any change in general medical condition-none  Current opioids rx-Tylenol with codeine twice daily as needed # meds rx-45 Effectiveness of current meds-works well Adverse reactions form pain meds-none Morphine equivalent-unable to calculate  Pill count performed-No Last drug screen -11/17/2019 ( high risk q36m moderate risk q646mlow risk yearly ) Urine drug screen today- No Was the [facility] [place]-yes  If yes were their any concerning findings? -None  No flowsheet data found.   Pain contract signed on: 11/17/2019  Hypertension Patient is currently on lisinopril and hydrochlorothiazide, and their blood pressure today is 114/68. Patient denies any lightheadedness or dizziness. Patient denies headaches, blurred vision, chest pains, shortness of breath, or weakness. Denies any side effects from medication and is content with current medication.   Type 1 diabetes mellitus, sees endocrinology Patient comes in today for recheck of his diabetes. Patient has been currently taking NovoLog pump. Patient is currently on an ACE inhibitor/ARB. Patient has not seen an ophthalmologist this year. Patient denies any  issues with their feet. The symptom started onset as an adult retinopathy and CKD ARE RELATED TO DM   Relevant past medical, surgical, family and social history reviewed and updated as indicated. Interim medical history since our last visit reviewed. Allergies and medications reviewed and updated.  Review of Systems  Constitutional: Negative for chills and fever.  Eyes: Negative for visual disturbance.  Respiratory: Negative for chest tightness and shortness of breath.   Cardiovascular: Negative for chest pain and leg swelling.  Musculoskeletal: Positive for arthralgias. Negative for gait problem.  Skin: Negative for rash.  Neurological: Negative for light-headedness and headaches.  Psychiatric/Behavioral: Negative for agitation and behavioral problems.  All other systems reviewed and are negative.   Per HPI unless specifically indicated above   Allergies as of 02/15/2020      Reactions   Aspirin    States is not suppose to take due to eye hemorrhages.       Medication List       Accurate as of February 15, 2020 11:25 AM. If you have any questions, ask your nurse or doctor.        STOP taking these medications   celecoxib 100 MG capsule Commonly known as: CeleBREX Stopped by: JoFransisca Kaufmannettinger, MD     TAKE these medications   acetaminophen 500 MG tablet Commonly known as: TYLENOL Take 500 mg by mouth every 6 (six) hours as needed.   acetaminophen-codeine 300-30 MG tablet Commonly known as: TYLENOL #3 every 4 (four) hours as needed.   clotrimazole-betamethasone cream Commonly known as: LOTRISONE Apply topically 2 (two) times daily.   DULoxetine  30 MG capsule Commonly known as: CYMBALTA Take 1 capsule (30 mg total) by mouth daily.   famotidine 20 MG tablet Commonly known as: PEPCID Take 1 tablet (20 mg total) by mouth 2 (two) times daily. Started by: Fransisca Kaufmann Dettinger, MD   glucose blood test strip Commonly known as: Contour Next Test Test BS 3 times  daily   hydrochlorothiazide 25 MG tablet Commonly known as: HYDRODIURIL Take 25 mg by mouth daily.   insulin aspart 100 UNIT/ML injection Commonly known as: novoLOG Use approximately 80 units daily via Insulin Pump  DX E10.9 and Z96.41   lisinopril 10 MG tablet Commonly known as: ZESTRIL Take 1 tablet (10 mg total) by mouth daily.   omeprazole 20 MG capsule Commonly known as: PRILOSEC Take 1 capsule (20 mg total) by mouth daily.   oxazepam 15 MG capsule Commonly known as: SERAX Take 1 capsule (15 mg total) by mouth daily as needed for sleep.   TRUEplus Lancets 33G Misc Test blood sugars 3 times daily   VITAMIN C PO Take by mouth.   Vitamin D (Ergocalciferol) 1.25 MG (50000 UNIT) Caps capsule Commonly known as: DRISDOL Take 50,000 Units by mouth every 7 (seven) days.   vitamin E 180 MG (400 UNITS) capsule Take by mouth.        Objective:   BP 114/68   Pulse 95   Temp 98 F (36.7 C)   Ht 5' 4" (1.626 m)   Wt 235 lb 8 oz (106.8 kg)   LMP 03/24/2013   SpO2 97%   BMI 40.42 kg/m   Wt Readings from Last 3 Encounters:  02/15/20 235 lb 8 oz (106.8 kg)  11/11/19 236 lb 6 oz (107.2 kg)  08/04/18 223 lb 6.4 oz (101.3 kg)    Physical Exam Vitals and nursing note reviewed.  Constitutional:      General: She is not in acute distress.    Appearance: She is well-developed. She is not diaphoretic.  Eyes:     Conjunctiva/sclera: Conjunctivae normal.     Pupils: Pupils are equal, round, and reactive to light.  Cardiovascular:     Rate and Rhythm: Normal rate and regular rhythm.     Heart sounds: Murmur (3 out of 6 systolic murmur best heard at right intercostal space) heard.   Pulmonary:     Effort: Pulmonary effort is normal. No respiratory distress.     Breath sounds: Normal breath sounds. No wheezing.  Musculoskeletal:        General: Normal range of motion.  Skin:    General: Skin is warm and dry.     Findings: No rash.  Neurological:     Mental Status:  She is alert and oriented to person, place, and time.     Coordination: Coordination normal.  Psychiatric:        Behavior: Behavior normal.       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  Patient has a heart murmur and says that she is never had it evaluated, will send with cardiogram.  Added famotidine for GERD symptoms  Follow up plan: Return in about 3 months (around 05/17/2020), or if symptoms worsen or fail to improve, for Pain management and anxiety recheck.  Counseling provided for all of the vaccine components Orders Placed This Encounter  Procedures  . BMP8+EGFR  . ECHOCARDIOGRAM COMPLETE    Caryl Pina, MD [facility] 02/15/2020, 11:25 AM

## 2020-02-23 ENCOUNTER — Other Ambulatory Visit: Payer: Self-pay

## 2020-02-23 ENCOUNTER — Ambulatory Visit (HOSPITAL_COMMUNITY)
Admission: RE | Admit: 2020-02-23 | Discharge: 2020-02-23 | Disposition: A | Discharge status: Home / Self Care | Payer: Medicare Other | Source: Ambulatory Visit | Attending: Family Medicine | Admitting: Family Medicine

## 2020-02-23 DIAGNOSIS — R011 Cardiac murmur, unspecified: Secondary | ICD-10-CM | POA: Insufficient documentation

## 2020-02-23 LAB — ECHOCARDIOGRAM COMPLETE
Area-P 1/2: 2.9 cm2
S' Lateral: 2.78 cm

## 2020-02-23 NOTE — Progress Notes (Signed)
*  PRELIMINARY RESULTS* Echocardiogram 2D Echocardiogram has been performed.  Carol Jensen 02/23/2020, 1:48 PM

## 2020-03-09 ENCOUNTER — Encounter: Payer: Self-pay | Admitting: *Deleted

## 2020-03-22 ENCOUNTER — Other Ambulatory Visit: Payer: Self-pay

## 2020-03-22 ENCOUNTER — Ambulatory Visit (INDEPENDENT_AMBULATORY_CARE_PROVIDER_SITE_OTHER): Payer: Medicare Other | Admitting: *Deleted

## 2020-03-22 DIAGNOSIS — Z23 Encounter for immunization: Secondary | ICD-10-CM | POA: Diagnosis not present

## 2020-04-18 DIAGNOSIS — Z6841 Body Mass Index (BMI) 40.0 and over, adult: Secondary | ICD-10-CM | POA: Diagnosis not present

## 2020-04-18 DIAGNOSIS — E103519 Type 1 diabetes mellitus with proliferative diabetic retinopathy with macular edema, unspecified eye: Secondary | ICD-10-CM | POA: Diagnosis not present

## 2020-04-18 DIAGNOSIS — Z9641 Presence of insulin pump (external) (internal): Secondary | ICD-10-CM | POA: Diagnosis not present

## 2020-04-18 DIAGNOSIS — E1065 Type 1 diabetes mellitus with hyperglycemia: Secondary | ICD-10-CM | POA: Diagnosis not present

## 2020-04-18 DIAGNOSIS — E10649 Type 1 diabetes mellitus with hypoglycemia without coma: Secondary | ICD-10-CM | POA: Diagnosis not present

## 2020-05-18 ENCOUNTER — Other Ambulatory Visit: Payer: Self-pay | Admitting: Family Medicine

## 2020-05-18 DIAGNOSIS — F411 Generalized anxiety disorder: Secondary | ICD-10-CM

## 2020-05-25 ENCOUNTER — Ambulatory Visit (INDEPENDENT_AMBULATORY_CARE_PROVIDER_SITE_OTHER): Payer: Medicare Other | Admitting: Family Medicine

## 2020-05-25 ENCOUNTER — Encounter: Payer: Self-pay | Admitting: Family Medicine

## 2020-05-25 ENCOUNTER — Other Ambulatory Visit: Payer: Self-pay

## 2020-05-25 VITALS — BP 128/67 | HR 69 | Ht 64.0 in | Wt 230.0 lb

## 2020-05-25 DIAGNOSIS — Z23 Encounter for immunization: Secondary | ICD-10-CM

## 2020-05-25 DIAGNOSIS — N183 Chronic kidney disease, stage 3 unspecified: Secondary | ICD-10-CM

## 2020-05-25 DIAGNOSIS — E1159 Type 2 diabetes mellitus with other circulatory complications: Secondary | ICD-10-CM | POA: Diagnosis not present

## 2020-05-25 DIAGNOSIS — M19012 Primary osteoarthritis, left shoulder: Secondary | ICD-10-CM

## 2020-05-25 DIAGNOSIS — E103599 Type 1 diabetes mellitus with proliferative diabetic retinopathy without macular edema, unspecified eye: Secondary | ICD-10-CM

## 2020-05-25 DIAGNOSIS — M19011 Primary osteoarthritis, right shoulder: Secondary | ICD-10-CM

## 2020-05-25 DIAGNOSIS — F411 Generalized anxiety disorder: Secondary | ICD-10-CM | POA: Diagnosis not present

## 2020-05-25 DIAGNOSIS — E1022 Type 1 diabetes mellitus with diabetic chronic kidney disease: Secondary | ICD-10-CM

## 2020-05-25 DIAGNOSIS — I152 Hypertension secondary to endocrine disorders: Secondary | ICD-10-CM

## 2020-05-25 MED ORDER — OXAZEPAM 15 MG PO CAPS: 15.0000 mg | ORAL_CAPSULE | Freq: Every day | ORAL | 2 refills | Status: DC | PRN

## 2020-05-25 MED ORDER — CLOTRIMAZOLE-BETAMETHASONE 1-0.05 % EX CREA
TOPICAL_CREAM | Freq: Two times a day (BID) | CUTANEOUS | 2 refills | Status: DC
Start: 2020-05-25 — End: 2021-02-22

## 2020-05-25 MED ORDER — ACETAMINOPHEN-CODEINE #3 300-30 MG PO TABS: ORAL_TABLET | ORAL | 0 refills | Status: DC

## 2020-05-25 NOTE — Addendum Note (Signed)
Addended by: Dorene Sorrow on: 05/25/2020 11:11 AM   Modules accepted: Orders

## 2020-05-25 NOTE — Progress Notes (Signed)
BP 128/67   Pulse 69   Ht 5\' 4"  (1.626 m)   Wt 230 lb (104.3 kg)   LMP 03/24/2013   SpO2 99%   BMI 39.48 kg/m    Subjective:   Patient ID: 05/24/2013, female    DOB: 10-24-1960, 59 y.o.   MRN: [id number]  HPI: Carol Jensen is a 59 y.o. female presenting on 05/25/2020 for Medical Management of Chronic Issues, Diabetes, and Anxiety   HPI Type 1 diabetes mellitus Patient comes in today for recheck of his diabetes. Patient has been currently taking NovoLog and on a pump. Patient is currently on an ACE inhibitor/ARB. Patient has seen an ophthalmologist this year. Patient denies any issues with their feet. The symptom started onset as an adult hypertension and hyperlipidemia and retinopathy and CKD stage III ARE RELATED TO DM   Anxiety and chronic shoulder pain arthritis Current rx-Tylenol 3, 45 tablets usually last 90 days and oxazepam 15 mg nightly as needed, #30 # meds rx-45 and 30 Effectiveness of current meds-working well Adverse reactions form meds-none  Pill count performed-No Last drug screen -11/17/2019 ( high risk q40m, moderate risk q55m, low risk yearly ) Urine drug screen today- No Was the NCCSR reviewed-yes  If yes were their any concerning findings? -None  No flowsheet data found.   Controlled substance contract signed on: 11/17/2019  Hypertension Patient is currently on hydrochlorothiazide and lisinopril, and their blood pressure today is 128/67. Patient denies any lightheadedness or dizziness. Patient denies headaches, blurred vision, chest pains, shortness of breath, or weakness. Denies any side effects from medication and is content with current medication.   Relevant past medical, surgical, family and social history reviewed and updated as indicated. Interim medical history since our last visit reviewed. Allergies and medications reviewed and updated.  Review of Systems  Constitutional: Negative for chills and fever.  Eyes: Negative for visual disturbance.   Respiratory: Negative for chest tightness and shortness of breath.   Cardiovascular: Negative for chest pain and leg swelling.  Musculoskeletal: Negative for back pain and gait problem.  Skin: Negative for rash.  Neurological: Negative for dizziness, light-headedness and headaches.  Psychiatric/Behavioral: Negative for agitation and behavioral problems.  All other systems reviewed and are negative.   Per HPI unless specifically indicated above   Allergies as of 05/25/2020      Reactions   Aspirin    States is not suppose to take due to eye hemorrhages.       Medication List       Accurate as of May 25, 2020  8:14 AM. If you have any questions, ask your nurse or doctor.        acetaminophen 500 MG tablet Commonly known as: TYLENOL Take 500 mg by mouth every 6 (six) hours as needed.   acetaminophen-codeine 300-30 MG tablet Commonly known as: TYLENOL #3 every 4 (four) hours as needed.   clotrimazole-betamethasone cream Commonly known as: LOTRISONE Apply topically 2 (two) times daily.   DULoxetine 30 MG capsule Commonly known as: CYMBALTA Take 1 capsule (30 mg total) by mouth daily.   famotidine 20 MG tablet Commonly known as: PEPCID Take 1 tablet (20 mg total) by mouth 2 (two) times daily.   glucose blood test strip Commonly known as: Contour Next Test Test BS 3 times daily   hydrochlorothiazide 25 MG tablet Commonly known as: HYDRODIURIL Take 25 mg by mouth daily.   insulin aspart 100 UNIT/ML injection Commonly known as: novoLOG Use approximately 80  units daily via Insulin Pump  DX E10.9 and Z96.41   lisinopril 10 MG tablet Commonly known as: ZESTRIL Take 1 tablet (10 mg total) by mouth daily.   omeprazole 20 MG capsule Commonly known as: PRILOSEC Take 1 capsule (20 mg total) by mouth daily.   oxazepam 15 MG capsule Commonly known as: SERAX Take 1 capsule (15 mg total) by mouth daily as needed for sleep.   TRUEplus Lancets 33G Misc Test blood  sugars 3 times daily   VITAMIN C PO Take by mouth.   Vitamin D (Ergocalciferol) 1.25 MG (50000 UNIT) Caps capsule Commonly known as: DRISDOL Take 50,000 Units by mouth every 7 (seven) days.   vitamin E 180 MG (400 UNITS) capsule Take by mouth.        Objective:   BP 128/67   Pulse 69   Ht 5\' 4"  (1.626 m)   Wt 230 lb (104.3 kg)   LMP 03/24/2013   SpO2 99%   BMI 39.48 kg/m   Wt Readings from Last 3 Encounters:  05/25/20 230 lb (104.3 kg)  02/15/20 235 lb 8 oz (106.8 kg)  11/11/19 236 lb 6 oz (107.2 kg)    Physical Exam Vitals and nursing note reviewed.  Constitutional:      General: She is not in acute distress.    Appearance: She is well-developed. She is not diaphoretic.  Eyes:     Conjunctiva/sclera: Conjunctivae normal.  Cardiovascular:     Rate and Rhythm: Normal rate and regular rhythm.     Heart sounds: Normal heart sounds. No murmur heard.   Pulmonary:     Effort: Pulmonary effort is normal. No respiratory distress.     Breath sounds: Normal breath sounds. No wheezing.  Musculoskeletal:        General: No tenderness. Normal range of motion.  Skin:    General: Skin is warm and dry.     Findings: No rash.  Neurological:     Mental Status: She is alert and oriented to person, place, and time.     Coordination: Coordination normal.  Psychiatric:        Behavior: Behavior normal.       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  Continue current medications, the refill for oxazepam and Tylenol 3. Follow up plan: Return in about 3 months (around 08/23/2020), or if symptoms worsen or fail to improve, for Anxiety and diabetes follow-up.  Counseling provided for all of the vaccine components No orders of the defined types were placed in this encounter.   10/23/2020, MD [facility] 05/25/2020, 8:14 AM

## 2020-06-08 DIAGNOSIS — Z23 Encounter for immunization: Secondary | ICD-10-CM | POA: Diagnosis not present

## 2020-07-19 DIAGNOSIS — E1065 Type 1 diabetes mellitus with hyperglycemia: Secondary | ICD-10-CM | POA: Diagnosis not present

## 2020-07-19 DIAGNOSIS — E103519 Type 1 diabetes mellitus with proliferative diabetic retinopathy with macular edema, unspecified eye: Secondary | ICD-10-CM | POA: Diagnosis not present

## 2020-07-19 DIAGNOSIS — Z9641 Presence of insulin pump (external) (internal): Secondary | ICD-10-CM | POA: Diagnosis not present

## 2020-07-19 DIAGNOSIS — E10649 Type 1 diabetes mellitus with hypoglycemia without coma: Secondary | ICD-10-CM | POA: Diagnosis not present

## 2020-08-22 ENCOUNTER — Other Ambulatory Visit: Payer: Self-pay | Admitting: Family Medicine

## 2020-08-22 DIAGNOSIS — F411 Generalized anxiety disorder: Secondary | ICD-10-CM

## 2020-08-24 ENCOUNTER — Other Ambulatory Visit: Payer: Self-pay

## 2020-08-24 ENCOUNTER — Encounter: Payer: Self-pay | Admitting: Family Medicine

## 2020-08-24 ENCOUNTER — Ambulatory Visit (INDEPENDENT_AMBULATORY_CARE_PROVIDER_SITE_OTHER): Payer: Medicare Other | Admitting: Family Medicine

## 2020-08-24 VITALS — BP 117/66 | HR 70 | Ht 64.0 in | Wt 227.0 lb

## 2020-08-24 DIAGNOSIS — M19011 Primary osteoarthritis, right shoulder: Secondary | ICD-10-CM | POA: Diagnosis not present

## 2020-08-24 DIAGNOSIS — E1022 Type 1 diabetes mellitus with diabetic chronic kidney disease: Secondary | ICD-10-CM | POA: Diagnosis not present

## 2020-08-24 DIAGNOSIS — E1159 Type 2 diabetes mellitus with other circulatory complications: Secondary | ICD-10-CM | POA: Diagnosis not present

## 2020-08-24 DIAGNOSIS — F411 Generalized anxiety disorder: Secondary | ICD-10-CM

## 2020-08-24 DIAGNOSIS — N183 Chronic kidney disease, stage 3 unspecified: Secondary | ICD-10-CM

## 2020-08-24 DIAGNOSIS — I152 Hypertension secondary to endocrine disorders: Secondary | ICD-10-CM | POA: Diagnosis not present

## 2020-08-24 DIAGNOSIS — M19012 Primary osteoarthritis, left shoulder: Secondary | ICD-10-CM | POA: Diagnosis not present

## 2020-08-24 DIAGNOSIS — E103599 Type 1 diabetes mellitus with proliferative diabetic retinopathy without macular edema, unspecified eye: Secondary | ICD-10-CM

## 2020-08-24 MED ORDER — OXAZEPAM 15 MG PO CAPS: 15.0000 mg | ORAL_CAPSULE | Freq: Every day | ORAL | 2 refills | Status: DC | PRN

## 2020-08-24 MED ORDER — ACETAMINOPHEN-CODEINE #3 300-30 MG PO TABS: ORAL_TABLET | ORAL | 0 refills | Status: DC

## 2020-08-24 NOTE — Progress Notes (Signed)
BP 117/66   Pulse 70   Ht 5' 4" (1.626 m)   Wt 227 lb (103 kg)   LMP 03/24/2013   SpO2 99%   BMI 38.96 kg/m    Subjective:   [name], female    DOB: 12/24/1960, 60 y.o.   MRN: [id number]  HPI: Carol Jensen is a 60 y.o. female presenting on 08/24/2020 for Medical Management of Chronic Issues, Diabetes, Hypertension, and Anxiety   HPI Pain and anxiety assessment: Cause of pain-bilateral shoulder arthritis Pain location-both shoulders Pain on scale of 1-10- 1 Frequency-infrequently, does not use every day, does use the oxazepam every day for anxiety. What increases pain-certain overuse but not all the time What makes pain Better-rest and Tylenol with codeine Effects on ADL -minimal Any change in general medical condition-none  Current rx-Tylenol 3 every 4 hours as needed 45 tablets, last 3 months and oxazepam 15 mg daily as needed # meds rx-45 tablets of Tylenol 3 for 3 months, oxazepam 15 mg give 30/month Effectiveness of current meds-working well Adverse reactions from pain meds-none Morphine equivalent-5  Pill count performed-No Last drug screen -11/17/2019 ( high risk q60m moderate risk q657mlow risk yearly ) Urine drug screen today- No Was the [place]yes  If yes were their any concerning findings? -None   No flowsheet data found.   Pain contract signed on: 11/17/2019  Patient has an endocrinology and follows up with them for her blood work.  Relevant past medical, surgical, family and social history reviewed and updated as indicated. Interim medical history since our last visit reviewed. Allergies and medications reviewed and updated.  Review of Systems  Constitutional: Negative for chills and fever.  Eyes: Negative for visual disturbance.  Respiratory: Negative for chest tightness and shortness of breath.   Cardiovascular: Negative for chest pain and leg swelling.  Musculoskeletal: Positive for arthralgias. Negative for back pain and  gait problem.  Skin: Negative for rash.  Neurological: Negative for light-headedness and headaches.  Psychiatric/Behavioral: Positive for sleep disturbance. Negative for agitation and behavioral problems. The patient is nervous/anxious.   All other systems reviewed and are negative.   Per HPI unless specifically indicated above   Allergies as of 08/24/2020      Reactions   Aspirin    States is not suppose to take due to eye hemorrhages.       Medication List       Accurate as of August 24, 2020 10:07 AM. If you have any questions, ask your nurse or doctor.        acetaminophen 500 MG tablet Commonly known as: TYLENOL Take 500 mg by mouth every 6 (six) hours as needed.   acetaminophen-codeine 300-30 MG tablet Commonly known as: TYLENOL #3 every 4 (four) hours as needed.   clotrimazole-betamethasone cream Commonly known as: LOTRISONE Apply topically 2 (two) times daily.   DULoxetine 30 MG capsule Commonly known as: CYMBALTA Take 1 capsule (30 mg total) by mouth daily.   famotidine 20 MG tablet Commonly known as: PEPCID Take 1 tablet (20 mg total) by mouth 2 (two) times daily.   glucose blood test strip Commonly known as: Contour Next Test Test BS 3 times daily   hydrochlorothiazide 25 MG tablet Commonly known as: HYDRODIURIL Take 25 mg by mouth daily.   insulin aspart 100 UNIT/ML injection Commonly known as: novoLOG Use approximately 80 units daily via Insulin Pump  DX E10.9 and Z96.41   lisinopril 10 MG tablet Commonly known  as: ZESTRIL Take 1 tablet (10 mg total) by mouth daily.   omeprazole 20 MG capsule Commonly known as: PRILOSEC Take 1 capsule (20 mg total) by mouth daily.   oxazepam 15 MG capsule Commonly known as: SERAX Take 1 capsule (15 mg total) by mouth daily as needed for sleep.   TRUEplus Lancets 33G Misc Test blood sugars 3 times daily   VITAMIN C PO Take by mouth.   Vitamin D (Ergocalciferol) 1.25 MG (50000 UNIT) Caps  capsule Commonly known as: DRISDOL Take 50,000 Units by mouth every 7 (seven) days.   vitamin E 180 MG (400 UNITS) capsule Take by mouth.        Objective:   BP 117/66   Pulse 70   Ht 5' 4" (1.626 m)   Wt 227 lb (103 kg)   LMP 03/24/2013   SpO2 99%   BMI 38.96 kg/m   Wt Readings from Last 3 Encounters:  08/24/20 227 lb (103 kg)  05/25/20 230 lb (104.3 kg)  02/15/20 235 lb 8 oz (106.8 kg)    Physical Exam Vitals and nursing note reviewed.  Constitutional:      General: She is not in acute distress.    Appearance: She is well-developed and well-nourished. She is not diaphoretic.  Eyes:     Extraocular Movements: EOM normal.     Conjunctiva/sclera: Conjunctivae normal.  Cardiovascular:     Rate and Rhythm: Normal rate and regular rhythm.     Pulses: Intact distal pulses.     Heart sounds: Normal heart sounds. No murmur heard.   Pulmonary:     Effort: Pulmonary effort is normal. No respiratory distress.     Breath sounds: Normal breath sounds. No wheezing.  Musculoskeletal:        General: No tenderness or edema. Normal range of motion.  Skin:    General: Skin is warm and dry.     Findings: No rash.  Neurological:     Mental Status: She is alert and oriented to person, place, and time.     Coordination: Coordination normal.  Psychiatric:        Mood and Affect: Mood and affect normal.        Behavior: Behavior normal.       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 Follow up plan: Return in about 3 months (around 11/24/2020), or if symptoms  worsen or fail to improve, for Anxiety and pain recheck.  Counseling provided for all of the vaccine components Orders Placed This Encounter  Procedures  . CBC with Differential/Platelet  . CMP14+EGFR  . Lipid panel  . [facility] Hb A1c [place], MD [facility] 08/24/2020, 10:07 AM

## 2020-09-25 ENCOUNTER — Other Ambulatory Visit: Payer: Self-pay | Admitting: Family Medicine

## 2020-10-17 DIAGNOSIS — E10649 Type 1 diabetes mellitus with hypoglycemia without coma: Secondary | ICD-10-CM | POA: Diagnosis not present

## 2020-10-17 DIAGNOSIS — E103519 Type 1 diabetes mellitus with proliferative diabetic retinopathy with macular edema, unspecified eye: Secondary | ICD-10-CM | POA: Diagnosis not present

## 2020-10-17 DIAGNOSIS — E1065 Type 1 diabetes mellitus with hyperglycemia: Secondary | ICD-10-CM | POA: Diagnosis not present

## 2020-10-17 DIAGNOSIS — Z9641 Presence of insulin pump (external) (internal): Secondary | ICD-10-CM | POA: Diagnosis not present

## 2020-10-17 DIAGNOSIS — Z6841 Body Mass Index (BMI) 40.0 and over, adult: Secondary | ICD-10-CM | POA: Diagnosis not present

## 2020-10-18 DIAGNOSIS — Z794 Long term (current) use of insulin: Secondary | ICD-10-CM | POA: Diagnosis not present

## 2020-10-18 DIAGNOSIS — Z961 Presence of intraocular lens: Secondary | ICD-10-CM | POA: Diagnosis not present

## 2020-10-18 DIAGNOSIS — E113593 Type 2 diabetes mellitus with proliferative diabetic retinopathy without macular edema, bilateral: Secondary | ICD-10-CM | POA: Diagnosis not present

## 2020-10-18 DIAGNOSIS — H2511 Age-related nuclear cataract, right eye: Secondary | ICD-10-CM | POA: Diagnosis not present

## 2020-11-16 ENCOUNTER — Encounter: Payer: Self-pay | Admitting: Family Medicine

## 2020-11-16 ENCOUNTER — Ambulatory Visit (INDEPENDENT_AMBULATORY_CARE_PROVIDER_SITE_OTHER): Payer: Medicare Other | Admitting: Family Medicine

## 2020-11-16 ENCOUNTER — Other Ambulatory Visit: Payer: Self-pay

## 2020-11-16 VITALS — BP 110/61 | HR 94 | Temp 98.7°F | Ht 64.0 in | Wt 227.0 lb

## 2020-11-16 DIAGNOSIS — N183 Chronic kidney disease, stage 3 unspecified: Secondary | ICD-10-CM

## 2020-11-16 DIAGNOSIS — M19012 Primary osteoarthritis, left shoulder: Secondary | ICD-10-CM

## 2020-11-16 DIAGNOSIS — E103599 Type 1 diabetes mellitus with proliferative diabetic retinopathy without macular edema, unspecified eye: Secondary | ICD-10-CM

## 2020-11-16 DIAGNOSIS — M19011 Primary osteoarthritis, right shoulder: Secondary | ICD-10-CM | POA: Diagnosis not present

## 2020-11-16 DIAGNOSIS — E1022 Type 1 diabetes mellitus with diabetic chronic kidney disease: Secondary | ICD-10-CM

## 2020-11-16 DIAGNOSIS — I152 Hypertension secondary to endocrine disorders: Secondary | ICD-10-CM | POA: Diagnosis not present

## 2020-11-16 DIAGNOSIS — Z79899 Other long term (current) drug therapy: Secondary | ICD-10-CM | POA: Diagnosis not present

## 2020-11-16 DIAGNOSIS — F411 Generalized anxiety disorder: Secondary | ICD-10-CM

## 2020-11-16 DIAGNOSIS — E1159 Type 2 diabetes mellitus with other circulatory complications: Secondary | ICD-10-CM | POA: Diagnosis not present

## 2020-11-16 MED ORDER — DULOXETINE HCL 30 MG PO CPEP: 30.0000 mg | ORAL_CAPSULE | Freq: Every day | ORAL | 3 refills | Status: DC

## 2020-11-16 MED ORDER — OXAZEPAM 15 MG PO CAPS: 15.0000 mg | ORAL_CAPSULE | Freq: Every day | ORAL | 2 refills | Status: DC | PRN

## 2020-11-16 MED ORDER — ACETAMINOPHEN-CODEINE #3 300-30 MG PO TABS: ORAL_TABLET | ORAL | 0 refills | Status: DC

## 2020-11-16 NOTE — Progress Notes (Signed)
BP 110/61   Pulse 94   Temp 98.7 F (37.1 C)   Ht 5\' 4"  (1.626 m)   Wt 227 lb (103 kg)   LMP 03/24/2013   SpO2 97%   BMI 38.96 kg/m    Subjective:   Patient ID: 05/24/2013, female    DOB: 1960/11/15, 60 y.o.   MRN: [id number]  HPI: Carol Jensen is a 60 y.o. female presenting on 11/16/2020 for Medical Management of Chronic Issues   HPI Pain and anxiety assessment: Cause of pain-arthritis in shoulders Pain location-both shoulders, right worse than left Pain on scale of 1-10- 4 Frequency-not every day, occasionally uses the Tylenol 3 What increases pain-sometimes overuse What makes pain Better-Tylenol 3 Effects on ADL -minimal Any change in general medical condition-none  Current opioids rx-Tylenol 3 every 4 hours as needed, also takes oxazepam 15 mg nightly as needed # meds rx- 45, 30 oxazepam Effectiveness of current meds-works well Adverse reactions from pain meds-none Morphine equivalent-6  Pill count performed-No Last drug screen -11/17/2019 ( high risk q60m, moderate risk q66m, low risk yearly ) Urine drug screen today- Yes Was the NCCSR reviewed-yes  If yes were their any concerning findings? -None  No flowsheet data found.    Pain contract signed on: Today  Type 1 diabetes mellitus Patient comes in today for recheck of his diabetes, patient sees endocrinology. Patient has been currently taking insulin pump. Patient is currently on an ACE inhibitor/ARB. Patient has not seen an ophthalmologist this year. Patient denies any issues with their feet. The symptom started onset as an adult hypertension ARE RELATED TO DM   Hypertension Patient is currently on lisinopril, and their blood pressure today is 110/61. Patient denies any lightheadedness or dizziness. Patient denies headaches, blurred vision, chest pains, shortness of breath, or weakness. Denies any side effects from medication and is content with current medication.   Relevant past medical, surgical, family  and social history reviewed and updated as indicated. Interim medical history since our last visit reviewed. Allergies and medications reviewed and updated.  Review of Systems  Constitutional: Negative for chills and fever.  Eyes: Negative for redness and visual disturbance.  Respiratory: Negative for chest tightness and shortness of breath.   Cardiovascular: Negative for chest pain and leg swelling.  Musculoskeletal: Negative for back pain and gait problem.  Skin: Negative for rash.  Neurological: Negative for light-headedness and headaches.  Psychiatric/Behavioral: Positive for dysphoric mood. Negative for agitation, behavioral problems, self-injury, sleep disturbance and suicidal ideas. The patient is nervous/anxious.   All other systems reviewed and are negative.   Per HPI unless specifically indicated above   Allergies as of 11/16/2020      Reactions   Aspirin    States is not suppose to take due to eye hemorrhages.       Medication List       Accurate as of November 16, 2020  8:42 AM. If you have any questions, ask your nurse or doctor.        acetaminophen 500 MG tablet Commonly known as: TYLENOL Take 500 mg by mouth every 6 (six) hours as needed.   acetaminophen-codeine 300-30 MG tablet Commonly known as: TYLENOL #3 every 4 (four) hours as needed.   clotrimazole-betamethasone cream Commonly known as: LOTRISONE Apply topically 2 (two) times daily.   DULoxetine 30 MG capsule Commonly known as: CYMBALTA Take 1 capsule by mouth once daily   famotidine 20 MG tablet Commonly known as: PEPCID Take 1 tablet (  20 mg total) by mouth 2 (two) times daily.   glucose blood test strip Commonly known as: Contour Next Test Test BS 3 times daily   hydrochlorothiazide 25 MG tablet Commonly known as: HYDRODIURIL Take 25 mg by mouth daily.   insulin aspart 100 UNIT/ML injection Commonly known as: novoLOG Use approximately 80 units daily via Insulin Pump  DX E10.9 and Z96.41    lisinopril 10 MG tablet Commonly known as: ZESTRIL Take 1 tablet (10 mg total) by mouth daily.   omeprazole 20 MG capsule Commonly known as: PRILOSEC Take 1 capsule by mouth once daily   oxazepam 15 MG capsule Commonly known as: SERAX Take 1 capsule (15 mg total) by mouth daily as needed for sleep.   TRUEplus Lancets 33G Misc Test blood sugars 3 times daily   VITAMIN C PO Take by mouth.   Vitamin D (Ergocalciferol) 1.25 MG (50000 UNIT) Caps capsule Commonly known as: DRISDOL Take 50,000 Units by mouth every 7 (seven) days.   vitamin E 180 MG (400 UNITS) capsule Take by mouth.        Objective:   BP 110/61   Pulse 94   Temp 98.7 F (37.1 C)   Ht 5\' 4"  (1.626 m)   Wt 227 lb (103 kg)   LMP 03/24/2013   SpO2 97%   BMI 38.96 kg/m   Wt Readings from Last 3 Encounters:  11/16/20 227 lb (103 kg)  08/24/20 227 lb (103 kg)  05/25/20 230 lb (104.3 kg)    Physical Exam Vitals and nursing note reviewed.  Constitutional:      General: She is not in acute distress.    Appearance: She is well-developed. She is not diaphoretic.  Eyes:     Conjunctiva/sclera: Conjunctivae normal.  Cardiovascular:     Rate and Rhythm: Normal rate and regular rhythm.     Heart sounds: Normal heart sounds. No murmur heard.   Pulmonary:     Effort: Pulmonary effort is normal. No respiratory distress.     Breath sounds: Normal breath sounds. No wheezing.  Musculoskeletal:        General: No tenderness. Normal range of motion.  Skin:    General: Skin is warm and dry.     Findings: No rash.  Neurological:     Mental Status: She is alert and oriented to person, place, and time.     Coordination: Coordination normal.  Psychiatric:        Mood and Affect: Mood is anxious and depressed.        Behavior: Behavior normal.        Thought Content: Thought content does not include suicidal ideation. Thought content does not include suicidal plan.       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  Continue current medicine. Follow up plan: No follow-ups on file.  Counseling provided for all of the vaccine components Orders Placed This Encounter  Procedures  . ToxASSURE Select 13 (MW), Urine    Arville Care, MD [facility] 11/16/2020, 8:42 AM

## 2020-11-23 LAB — TOXASSURE SELECT 13 (MW), URINE

## 2021-01-24 DIAGNOSIS — Z6841 Body Mass Index (BMI) 40.0 and over, adult: Secondary | ICD-10-CM | POA: Diagnosis not present

## 2021-01-24 DIAGNOSIS — Z9641 Presence of insulin pump (external) (internal): Secondary | ICD-10-CM | POA: Diagnosis not present

## 2021-01-24 DIAGNOSIS — E1065 Type 1 diabetes mellitus with hyperglycemia: Secondary | ICD-10-CM | POA: Diagnosis not present

## 2021-01-24 DIAGNOSIS — E10649 Type 1 diabetes mellitus with hypoglycemia without coma: Secondary | ICD-10-CM | POA: Diagnosis not present

## 2021-01-24 DIAGNOSIS — E103519 Type 1 diabetes mellitus with proliferative diabetic retinopathy with macular edema, unspecified eye: Secondary | ICD-10-CM | POA: Diagnosis not present

## 2021-02-22 ENCOUNTER — Other Ambulatory Visit: Payer: Self-pay | Admitting: *Deleted

## 2021-02-22 ENCOUNTER — Ambulatory Visit (INDEPENDENT_AMBULATORY_CARE_PROVIDER_SITE_OTHER): Payer: Medicare Other | Admitting: Family Medicine

## 2021-02-22 ENCOUNTER — Encounter: Payer: Self-pay | Admitting: Family Medicine

## 2021-02-22 ENCOUNTER — Other Ambulatory Visit: Payer: Self-pay

## 2021-02-22 VITALS — BP 112/74 | HR 100 | Ht 64.0 in | Wt 233.0 lb

## 2021-02-22 DIAGNOSIS — M19011 Primary osteoarthritis, right shoulder: Secondary | ICD-10-CM | POA: Diagnosis not present

## 2021-02-22 DIAGNOSIS — E103599 Type 1 diabetes mellitus with proliferative diabetic retinopathy without macular edema, unspecified eye: Secondary | ICD-10-CM

## 2021-02-22 DIAGNOSIS — E1159 Type 2 diabetes mellitus with other circulatory complications: Secondary | ICD-10-CM | POA: Diagnosis not present

## 2021-02-22 DIAGNOSIS — N183 Chronic kidney disease, stage 3 unspecified: Secondary | ICD-10-CM | POA: Diagnosis not present

## 2021-02-22 DIAGNOSIS — E1022 Type 1 diabetes mellitus with diabetic chronic kidney disease: Secondary | ICD-10-CM | POA: Diagnosis not present

## 2021-02-22 DIAGNOSIS — M19012 Primary osteoarthritis, left shoulder: Secondary | ICD-10-CM | POA: Diagnosis not present

## 2021-02-22 DIAGNOSIS — E1040 Type 1 diabetes mellitus with diabetic neuropathy, unspecified: Secondary | ICD-10-CM | POA: Insufficient documentation

## 2021-02-22 DIAGNOSIS — F411 Generalized anxiety disorder: Secondary | ICD-10-CM

## 2021-02-22 DIAGNOSIS — I152 Hypertension secondary to endocrine disorders: Secondary | ICD-10-CM

## 2021-02-22 DIAGNOSIS — E559 Vitamin D deficiency, unspecified: Secondary | ICD-10-CM

## 2021-02-22 DIAGNOSIS — R739 Hyperglycemia, unspecified: Secondary | ICD-10-CM | POA: Diagnosis not present

## 2021-02-22 LAB — BAYER DCA HB A1C WAIVED: HB A1C (BAYER DCA - WAIVED): 7.9 % — ABNORMAL HIGH (ref 4.8–5.6)

## 2021-02-22 MED ORDER — VITAMIN D (ERGOCALCIFEROL) 1.25 MG (50000 UNIT) PO CAPS: 50000.0000 [IU] | ORAL_CAPSULE | ORAL | 1 refills | Status: DC

## 2021-02-22 MED ORDER — CLOTRIMAZOLE-BETAMETHASONE 1-0.05 % EX CREA: TOPICAL_CREAM | Freq: Two times a day (BID) | CUTANEOUS | 2 refills | Status: DC

## 2021-02-22 MED ORDER — OMEPRAZOLE 20 MG PO CPDR: 20.0000 mg | DELAYED_RELEASE_CAPSULE | Freq: Every day | ORAL | 3 refills | Status: DC

## 2021-02-22 MED ORDER — FAMOTIDINE 20 MG PO TABS: 20.0000 mg | ORAL_TABLET | Freq: Two times a day (BID) | ORAL | 3 refills | Status: DC

## 2021-02-22 MED ORDER — ACETAMINOPHEN-CODEINE #3 300-30 MG PO TABS: ORAL_TABLET | ORAL | 0 refills | Status: DC

## 2021-02-22 MED ORDER — OXAZEPAM 15 MG PO CAPS: 15.0000 mg | ORAL_CAPSULE | Freq: Every day | ORAL | 2 refills | Status: DC | PRN

## 2021-02-22 NOTE — Progress Notes (Signed)
BP 112/74   Pulse 100   Ht 5' 4"  (1.626 m)   Wt 233 lb (105.7 kg)   LMP 03/24/2013   SpO2 97%   BMI 39.99 kg/m    Subjective:   [name], female    DOB: 1960-07-04, 60 y.o.   MRN: [id number]  HPI: Carol Jensen is a 60 y.o. female presenting on 02/22/2021 for Medical Management of Chronic Issues, Hypertension, and Anxiety   HPI Anxiety and pain recheck Current rx-Tylenol 3 with codeine, patient takes very rarely, gave 45 with no refills.  Oxazepam 15 mg daily as needed # meds rx- 30 Effectiveness of current meds-works well Adverse reactions form meds-noted  Pill count performed-No Last drug screen -11/25/2020 ( high risk q73m moderate risk q658mlow risk yearly ) Urine drug screen today- No Was the [place]-yes  If yes were their any concerning findings? -None  No flowsheet data found.   Controlled substance contract signed on: 11/25/2020  Type 1 diabetes mellitus Patient comes in today for recheck of his diabetes. Patient has been currently taking insulin pump, sees endocrinology. Patient is currently on an ACE inhibitor/ARB. Patient has seen an ophthalmologist this year. Patient denies any issues with their feet. The symptom started onset as an adult hypertension and retinopathy ARE RELATED TO DM.  Patient is getting little tingling and numbness and early signs of neuropathy on the outside right foot as well.  Hypertension Patient is currently on lisinopril and hydrochlorothiazide, and their blood pressure today is 112/74. Patient denies any lightheadedness or dizziness. Patient denies headaches, blurred vision, chest pains, shortness of breath, or weakness. Denies any side effects from medication and is content with current medication.   Relevant past medical, surgical, family and social history reviewed and updated as indicated. Interim medical history since our last visit reviewed. Allergies and medications reviewed and updated.  Review of Systems   Constitutional:  Negative for chills and fever.  HENT:  Negative for congestion, ear discharge and ear pain.   Eyes:  Negative for redness and visual disturbance.  Respiratory:  Negative for chest tightness and shortness of breath.   Cardiovascular:  Negative for chest pain and leg swelling.  Genitourinary:  Negative for difficulty urinating and dysuria.  Musculoskeletal:  Negative for back pain and gait problem.  Skin:  Negative for rash.  Neurological:  Positive for numbness. Negative for light-headedness and headaches.  Psychiatric/Behavioral:  Negative for agitation, behavioral problems, self-injury, sleep disturbance and suicidal ideas. The patient is nervous/anxious.   All other systems reviewed and are negative.  Per HPI unless specifically indicated above   Allergies as of 02/22/2021       Reactions   Aspirin    States is not suppose to take due to eye hemorrhages.         Medication List        Accurate as of February 22, 2021 11:03 AM. If you have any questions, ask your nurse or doctor.          acetaminophen 500 MG tablet Commonly known as: TYLENOL Take 500 mg by mouth every 6 (six) hours as needed.   acetaminophen-codeine 300-30 MG tablet Commonly known as: TYLENOL #3 every 4 (four) hours as needed.   clotrimazole-betamethasone cream Commonly known as: LOTRISONE Apply topically 2 (two) times daily.   DULoxetine 30 MG capsule Commonly known as: CYMBALTA Take 1 capsule (30 mg total) by mouth daily.   famotidine 20 MG tablet Commonly known as:  PEPCID Take 1 tablet (20 mg total) by mouth 2 (two) times daily.   glucose blood test strip Commonly known as: Contour Next Test Test BS 3 times daily   hydrochlorothiazide 25 MG tablet Commonly known as: HYDRODIURIL Take 25 mg by mouth daily.   insulin aspart 100 UNIT/ML injection Commonly known as: novoLOG Use approximately 80 units daily via Insulin Pump  DX E10.9 and Z96.41   lisinopril 10 MG  tablet Commonly known as: ZESTRIL Take 1 tablet (10 mg total) by mouth daily.   omeprazole 20 MG capsule Commonly known as: PRILOSEC Take 1 capsule (20 mg total) by mouth daily.   oxazepam 15 MG capsule Commonly known as: SERAX Take 1 capsule (15 mg total) by mouth daily as needed for sleep.   TRUEplus Lancets 33G Misc Test blood sugars 3 times daily   VITAMIN C PO Take by mouth.   Vitamin D (Ergocalciferol) 1.25 MG (50000 UNIT) Caps capsule Commonly known as: DRISDOL Take 50,000 Units by mouth every 7 (seven) days.   vitamin E 180 MG (400 UNITS) capsule Take by mouth.         Objective:   BP 112/74   Pulse 100   Ht 5' 4"  (1.626 m)   Wt 233 lb (105.7 kg)   LMP 03/24/2013   SpO2 97%   BMI 39.99 kg/m   Wt Readings from Last 3 Encounters:  02/22/21 233 lb (105.7 kg)  11/16/20 227 lb (103 kg)  08/24/20 227 lb (103 kg)    Physical Exam Vitals and nursing note reviewed.  Constitutional:      General: She is not in acute distress.    Appearance: She is well-developed. She is not diaphoretic.  Eyes:     Conjunctiva/sclera: Conjunctivae normal.     Pupils: Pupils are equal, round, and reactive to light.  Cardiovascular:     Rate and Rhythm: Normal rate and regular rhythm.     Heart sounds: Normal heart sounds. No murmur heard. Pulmonary:     Effort: Pulmonary effort is normal. No respiratory distress.     Breath sounds: Normal breath sounds. No wheezing.  Musculoskeletal:        General: No tenderness. Normal range of motion.  Skin:    General: Skin is warm and dry.     Findings: No rash.  Neurological:     Mental Status: She is alert and oriented to person, place, and time.     Coordination: Coordination normal.  Psychiatric:        Behavior: Behavior normal.    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  She was doing a lot of bananas and now has backed off on that.  Refill Tylenol 3 and oxazepam, rarely takes Tylenol 3. Follow up plan: Return in about 3 months (around 05/24/2021), or if symptoms worsen or fail to improve, for Anxiety diabetes recheck.  Counseling provided for all of the vaccine components Orders Placed This Encounter  Procedures   CBC with Differential/Platelet   CMP14+EGFR   Lipid panel   Bayer DCA Hb A1c Waived    Caryl Pina, MD [facility] 02/22/2021, 11:03 AM

## 2021-02-23 LAB — CBC WITH DIFFERENTIAL/PLATELET
Basophils Absolute: 0.1 10*3/uL (ref 0.0–0.2)
Basos: 1 %
EOS (ABSOLUTE): 0.3 10*3/uL (ref 0.0–0.4)
Eos: 4 %
Hematocrit: 33.5 % — ABNORMAL LOW (ref 34.0–46.6)
Hemoglobin: 10.9 g/dL — ABNORMAL LOW (ref 11.1–15.9)
Immature Grans (Abs): 0 10*3/uL (ref 0.0–0.1)
Immature Granulocytes: 0 %
Lymphocytes Absolute: 1.5 10*3/uL (ref 0.7–3.1)
Lymphs: 21 %
MCH: 29.2 pg (ref 26.6–33.0)
MCHC: 32.5 g/dL (ref 31.5–35.7)
MCV: 90 fL (ref 79–97)
Monocytes Absolute: 0.4 10*3/uL (ref 0.1–0.9)
Monocytes: 6 %
Neutrophils Absolute: 4.9 10*3/uL (ref 1.4–7.0)
Neutrophils: 68 %
Platelets: 263 10*3/uL (ref 150–450)
RBC: 3.73 x10E6/uL — ABNORMAL LOW (ref 3.77–5.28)
RDW: 12.2 % (ref 11.7–15.4)
WBC: 7.3 10*3/uL (ref 3.4–10.8)

## 2021-02-23 LAB — CMP14+EGFR
ALT: 14 IU/L (ref 0–32)
AST: 19 IU/L (ref 0–40)
Albumin/Globulin Ratio: 1.9 (ref 1.2–2.2)
Albumin: 4 g/dL (ref 3.8–4.9)
Alkaline Phosphatase: 114 IU/L (ref 44–121)
BUN/Creatinine Ratio: 22 (ref 9–23)
BUN: 25 mg/dL — ABNORMAL HIGH (ref 6–24)
Bilirubin Total: 0.2 mg/dL (ref 0.0–1.2)
CO2: 22 mmol/L (ref 20–29)
Calcium: 9.3 mg/dL (ref 8.7–10.2)
Chloride: 97 mmol/L (ref 96–106)
Creatinine, Ser: 1.14 mg/dL — ABNORMAL HIGH (ref 0.57–1.00)
Globulin, Total: 2.1 g/dL (ref 1.5–4.5)
Glucose: 366 mg/dL — ABNORMAL HIGH (ref 65–99)
Potassium: 5 mmol/L (ref 3.5–5.2)
Sodium: 133 mmol/L — ABNORMAL LOW (ref 134–144)
Total Protein: 6.1 g/dL (ref 6.0–8.5)
eGFR: 56 mL/min/{1.73_m2} — ABNORMAL LOW (ref >59)

## 2021-02-23 LAB — LIPID PANEL
Chol/HDL Ratio: 2 ratio (ref 0.0–4.4)
Cholesterol, Total: 145 mg/dL (ref 100–199)
HDL: 73 mg/dL (ref >39)
LDL Chol Calc (NIH): 53 mg/dL (ref 0–99)
Triglycerides: 104 mg/dL (ref 0–149)
VLDL Cholesterol Cal: 19 mg/dL (ref 5–40)

## 2021-02-26 DIAGNOSIS — U071 COVID-19: Secondary | ICD-10-CM | POA: Diagnosis not present

## 2021-03-20 ENCOUNTER — Ambulatory Visit (INDEPENDENT_AMBULATORY_CARE_PROVIDER_SITE_OTHER): Payer: Medicare Other

## 2021-03-20 ENCOUNTER — Other Ambulatory Visit: Payer: Self-pay

## 2021-03-20 DIAGNOSIS — Z23 Encounter for immunization: Secondary | ICD-10-CM | POA: Diagnosis not present

## 2021-04-26 ENCOUNTER — Telehealth: Payer: Self-pay | Admitting: Family Medicine

## 2021-04-26 DIAGNOSIS — E103513 Type 1 diabetes mellitus with proliferative diabetic retinopathy with macular edema, bilateral: Secondary | ICD-10-CM | POA: Diagnosis not present

## 2021-04-26 DIAGNOSIS — E10649 Type 1 diabetes mellitus with hypoglycemia without coma: Secondary | ICD-10-CM | POA: Diagnosis not present

## 2021-04-26 DIAGNOSIS — Z9641 Presence of insulin pump (external) (internal): Secondary | ICD-10-CM | POA: Diagnosis not present

## 2021-04-26 DIAGNOSIS — Z6841 Body Mass Index (BMI) 40.0 and over, adult: Secondary | ICD-10-CM | POA: Diagnosis not present

## 2021-04-26 NOTE — Telephone Encounter (Signed)
Left message for patient to call back and schedule Medicare Annual Wellness Visit (AWV) either virtually or in office. I left both office number and my number [phone]  *due 06/18/2009 awvi per [facility]  please schedule at anytime with health coach  This should be a 45 minute visit.  

## 2021-05-21 ENCOUNTER — Other Ambulatory Visit: Payer: Self-pay | Admitting: Family Medicine

## 2021-05-21 DIAGNOSIS — F411 Generalized anxiety disorder: Secondary | ICD-10-CM

## 2021-05-23 ENCOUNTER — Other Ambulatory Visit: Payer: Self-pay | Admitting: Family Medicine

## 2021-05-23 DIAGNOSIS — F411 Generalized anxiety disorder: Secondary | ICD-10-CM

## 2021-05-24 ENCOUNTER — Telehealth: Payer: Self-pay | Admitting: Family Medicine

## 2021-05-24 DIAGNOSIS — F411 Generalized anxiety disorder: Secondary | ICD-10-CM

## 2021-05-24 NOTE — Telephone Encounter (Signed)
  Prescription Request  05/24/2021  Is this a "Controlled Substance" medicine? YES  Have you seen your PCP in the last 2 weeks? NO  If YES, route message to pool  -  If NO, patient needs to be scheduled for appointment.  What is the name of the medication or equipment? Oxazepam 15 mg . Patient is out and has appt 1-18 for 4 month  Have you contacted your pharmacy to request a refill? YES   Which pharmacy would you like this sent to? [facility] in [place]   Patient notified that their request is being sent to the clinical staff for review and that they should receive a response within 2 business days.

## 2021-05-25 MED ORDER — OXAZEPAM 15 MG PO CAPS: 15.0000 mg | ORAL_CAPSULE | Freq: Every day | ORAL | 0 refills | Status: DC | PRN

## 2021-05-25 NOTE — Telephone Encounter (Signed)
Pt called and aware

## 2021-05-25 NOTE — Telephone Encounter (Signed)
I have sent 1 month refill, please follow-up with who ever made the appointment and make sure education is appropriate

## 2021-05-25 NOTE — Telephone Encounter (Signed)
Appt ws made same day as her appt 02/22/21 and check outmade it for 4 months instead of 3

## 2021-05-25 NOTE — Telephone Encounter (Signed)
She can see if something is available sooner or be put on a wait list but 3 months ago I did say that she needed a 4-month appointment and gave her 3 months worth and it has been 3 months so I do not know why her appointment is in January.  I cannot feel outside of a visit due to regulations and laws and controlled substance policy

## 2021-06-22 ENCOUNTER — Ambulatory Visit: Payer: Medicare Other | Admitting: Family Medicine

## 2021-07-05 ENCOUNTER — Ambulatory Visit (INDEPENDENT_AMBULATORY_CARE_PROVIDER_SITE_OTHER): Payer: Medicare Other | Admitting: Family Medicine

## 2021-07-05 ENCOUNTER — Encounter: Payer: Self-pay | Admitting: Family Medicine

## 2021-07-05 VITALS — BP 110/61 | HR 96 | Temp 97.4°F | Ht 64.0 in | Wt 238.0 lb

## 2021-07-05 DIAGNOSIS — E1022 Type 1 diabetes mellitus with diabetic chronic kidney disease: Secondary | ICD-10-CM | POA: Diagnosis not present

## 2021-07-05 DIAGNOSIS — N183 Chronic kidney disease, stage 3 unspecified: Secondary | ICD-10-CM

## 2021-07-05 DIAGNOSIS — E559 Vitamin D deficiency, unspecified: Secondary | ICD-10-CM | POA: Diagnosis not present

## 2021-07-05 DIAGNOSIS — M19012 Primary osteoarthritis, left shoulder: Secondary | ICD-10-CM

## 2021-07-05 DIAGNOSIS — E103599 Type 1 diabetes mellitus with proliferative diabetic retinopathy without macular edema, unspecified eye: Secondary | ICD-10-CM

## 2021-07-05 DIAGNOSIS — F411 Generalized anxiety disorder: Secondary | ICD-10-CM | POA: Diagnosis not present

## 2021-07-05 DIAGNOSIS — I152 Hypertension secondary to endocrine disorders: Secondary | ICD-10-CM

## 2021-07-05 DIAGNOSIS — M19011 Primary osteoarthritis, right shoulder: Secondary | ICD-10-CM

## 2021-07-05 DIAGNOSIS — E1159 Type 2 diabetes mellitus with other circulatory complications: Secondary | ICD-10-CM | POA: Diagnosis not present

## 2021-07-05 MED ORDER — CLOTRIMAZOLE-BETAMETHASONE 1-0.05 % EX CREA: TOPICAL_CREAM | Freq: Two times a day (BID) | CUTANEOUS | 2 refills | Status: DC

## 2021-07-05 MED ORDER — ACETAMINOPHEN-CODEINE #3 300-30 MG PO TABS: ORAL_TABLET | ORAL | 0 refills | Status: DC

## 2021-07-05 MED ORDER — OXAZEPAM 15 MG PO CAPS: 15.0000 mg | ORAL_CAPSULE | Freq: Every day | ORAL | 2 refills | Status: DC | PRN

## 2021-07-05 NOTE — Progress Notes (Signed)
BP 110/61    Pulse 96    Temp (!) 97.4 F (36.3 C) (Temporal)    Ht 5' 4"  (1.626 m)    Wt 238 lb (108 kg)    LMP 03/24/2013    SpO2 97%    BMI 40.85 kg/m    Subjective:   [name], female    DOB: 11-Dec-1960, 61 y.o.   MRN: [id number]  HPI: Carol Jensen is a 61 y.o. female presenting on 07/05/2021 for Medical Management of Chronic Issues   HPI Anxiety recheck Current rx-Tylenol 3, as needed and oxazepam 15 mg daily as needed # meds rx-45 Tylenol 3 and lasts for a few months and 30/month of oxazepam Effectiveness of current meds-works well Adverse reactions form meds-none  Pill count performed-No Last drug screen -11/25/2020 ( high risk q23m moderate risk q663mlow risk yearly ) Urine drug screen today- No Was the [place]yes  If yes were their any concerning findings? -None  No flowsheet data found.   Controlled substance contract signed on: 11/25/2020  Patient sees endocrinology to manage her diabetes and CKD and cholesterol and we mostly manage her hypertension and anxiety.  Blood pressure looks good today.  Relevant past medical, surgical, family and social history reviewed and updated as indicated. Interim medical history since our last visit reviewed. Allergies and medications reviewed and updated.  Review of Systems  Constitutional:  Negative for chills and fever.  Eyes:  Negative for visual disturbance.  Respiratory:  Negative for chest tightness and shortness of breath.   Cardiovascular:  Negative for chest pain and leg swelling.  Genitourinary:  Negative for dysuria.  Musculoskeletal:  Negative for back pain and gait problem.  Skin:  Negative for rash.  Neurological:  Negative for light-headedness and headaches.  Psychiatric/Behavioral:  Negative for agitation and behavioral problems. The patient is nervous/anxious.   All other systems reviewed and are negative.  Per HPI unless specifically indicated above   Allergies as of 07/05/2021        Reactions   Aspirin    States is not suppose to take due to eye hemorrhages.         Medication List        Accurate as of July 05, 2021 11:23 AM. If you have any questions, ask your nurse or doctor.          acetaminophen 500 MG tablet Commonly known as: TYLENOL Take 500 mg by mouth every 6 (six) hours as needed.   acetaminophen-codeine 300-30 MG tablet Commonly known as: TYLENOL #3 every 4 (four) hours as needed.   clotrimazole-betamethasone cream Commonly known as: LOTRISONE Apply topically 2 (two) times daily.   DULoxetine 30 MG capsule Commonly known as: CYMBALTA Take 1 capsule (30 mg total) by mouth daily.   famotidine 20 MG tablet Commonly known as: PEPCID Take 1 tablet (20 mg total) by mouth 2 (two) times daily.   glucose blood test strip Commonly known as: Contour Next Test Test BS 3 times daily   hydrochlorothiazide 25 MG tablet Commonly known as: HYDRODIURIL Take 25 mg by mouth daily.   insulin aspart 100 UNIT/ML injection Commonly known as: novoLOG Use approximately 80 units daily via Insulin Pump  DX E10.9 and Z96.41   lisinopril 10 MG tablet Commonly known as: ZESTRIL Take 1 tablet (10 mg total) by mouth daily.   omeprazole 20 MG capsule Commonly known as: PRILOSEC Take 1 capsule (20 mg total) by mouth daily.   oxazepam  15 MG capsule Commonly known as: SERAX Take 1 capsule (15 mg total) by mouth daily as needed for sleep.   TRUEplus Lancets 33G Misc Test blood sugars 3 times daily   VITAMIN C PO Take by mouth.   Vitamin D (Ergocalciferol) 1.25 MG (50000 UNIT) Caps capsule Commonly known as: DRISDOL Take 1 capsule (50,000 Units total) by mouth every 7 (seven) days.   vitamin E 180 MG (400 UNITS) capsule Take by mouth.         Objective:   BP 110/61    Pulse 96    Temp (!) 97.4 F (36.3 C) (Temporal)    Ht 5' 4"  (1.626 m)    Wt 238 lb (108 kg)    LMP 03/24/2013    SpO2 97%    BMI 40.85 kg/m   Wt Readings from  Last 3 Encounters:  07/05/21 238 lb (108 kg)  02/22/21 233 lb (105.7 kg)  11/16/20 227 lb (103 kg)    Physical Exam Vitals and nursing note reviewed.  Constitutional:      General: She is not in acute distress.    Appearance: She is well-developed. She is not diaphoretic.  Eyes:     Conjunctiva/sclera: Conjunctivae normal.  Cardiovascular:     Rate and Rhythm: Normal rate and regular rhythm.     Heart sounds: Normal heart sounds. No murmur heard. Pulmonary:     Effort: Pulmonary effort is normal. No respiratory distress.     Breath sounds: Normal breath sounds. No wheezing.  Musculoskeletal:        General: No swelling or tenderness. Normal range of motion.  Skin:    General: Skin is warm and dry.     Findings: No rash.  Neurological:     Mental Status: She is alert and oriented to person, place, and time.     Coordination: Coordination normal.  Psychiatric:        Behavior: Behavior normal.      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  Follow-up in 3 months Follow up plan: Return in about 3 months (around 10/03/2021), or if symptoms worsen or fail to improve, for Anxiety and  hypertension recheck.  Counseling provided for all of the vaccine components Orders Placed This Encounter  Procedures   CMP14+EGFR   CBC with Differential/Platelet   Bayer DCA Hb A1c Waived   Vitamin D, 25-hydroxy    Caryl Pina, MD [facility] 07/05/2021, 11:23 AM

## 2021-07-27 ENCOUNTER — Telehealth: Payer: Self-pay | Admitting: Family Medicine

## 2021-07-27 NOTE — Telephone Encounter (Signed)
Patient declined the Medicare Wellness Visit with [facility] . She stated that she is not interested in completing this.

## 2021-08-02 DIAGNOSIS — Z6841 Body Mass Index (BMI) 40.0 and over, adult: Secondary | ICD-10-CM | POA: Diagnosis not present

## 2021-08-02 DIAGNOSIS — Z9641 Presence of insulin pump (external) (internal): Secondary | ICD-10-CM | POA: Diagnosis not present

## 2021-08-02 DIAGNOSIS — E103513 Type 1 diabetes mellitus with proliferative diabetic retinopathy with macular edema, bilateral: Secondary | ICD-10-CM | POA: Diagnosis not present

## 2021-08-02 DIAGNOSIS — E10649 Type 1 diabetes mellitus with hypoglycemia without coma: Secondary | ICD-10-CM | POA: Diagnosis not present

## 2021-10-04 ENCOUNTER — Ambulatory Visit (INDEPENDENT_AMBULATORY_CARE_PROVIDER_SITE_OTHER): Payer: Medicare Other | Admitting: Family Medicine

## 2021-10-04 ENCOUNTER — Encounter: Payer: Self-pay | Admitting: Family Medicine

## 2021-10-04 VITALS — BP 104/64 | HR 91 | Ht 64.0 in | Wt 240.0 lb

## 2021-10-04 DIAGNOSIS — F411 Generalized anxiety disorder: Secondary | ICD-10-CM

## 2021-10-04 DIAGNOSIS — Z79899 Other long term (current) drug therapy: Secondary | ICD-10-CM

## 2021-10-04 DIAGNOSIS — E1159 Type 2 diabetes mellitus with other circulatory complications: Secondary | ICD-10-CM | POA: Diagnosis not present

## 2021-10-04 DIAGNOSIS — E103599 Type 1 diabetes mellitus with proliferative diabetic retinopathy without macular edema, unspecified eye: Secondary | ICD-10-CM | POA: Diagnosis not present

## 2021-10-04 DIAGNOSIS — E1022 Type 1 diabetes mellitus with diabetic chronic kidney disease: Secondary | ICD-10-CM

## 2021-10-04 DIAGNOSIS — I152 Hypertension secondary to endocrine disorders: Secondary | ICD-10-CM

## 2021-10-04 DIAGNOSIS — N183 Chronic kidney disease, stage 3 unspecified: Secondary | ICD-10-CM | POA: Diagnosis not present

## 2021-10-04 DIAGNOSIS — E559 Vitamin D deficiency, unspecified: Secondary | ICD-10-CM

## 2021-10-04 DIAGNOSIS — M19012 Primary osteoarthritis, left shoulder: Secondary | ICD-10-CM

## 2021-10-04 DIAGNOSIS — M19011 Primary osteoarthritis, right shoulder: Secondary | ICD-10-CM

## 2021-10-04 LAB — BAYER DCA HB A1C WAIVED: HB A1C (BAYER DCA - WAIVED): 6.2 % — ABNORMAL HIGH (ref 4.8–5.6)

## 2021-10-04 MED ORDER — ACETAMINOPHEN-CODEINE #3 300-30 MG PO TABS: ORAL_TABLET | ORAL | 0 refills | Status: DC

## 2021-10-04 MED ORDER — DULOXETINE HCL 30 MG PO CPEP: 30.0000 mg | ORAL_CAPSULE | Freq: Every day | ORAL | 3 refills | Status: DC

## 2021-10-04 MED ORDER — OXAZEPAM 15 MG PO CAPS: 15.0000 mg | ORAL_CAPSULE | Freq: Every day | ORAL | 2 refills | Status: DC | PRN

## 2021-10-04 NOTE — Progress Notes (Signed)
? ?BP 104/64   Pulse 91   Ht $R'5\' 4"'Cb$  (1.626 m)   Wt 240 lb (108.9 kg)   LMP 03/24/2013   SpO2 98%   BMI 41.20 kg/m?   ? ?Subjective:  ? ?[name], female    DOB: Dec 29, 1960, 61 y.o.   MRN: [id number] ? ?HPI: ?Carol Jensen is a 61 y.o. female presenting on 10/04/2021 for Medical Management of Chronic Issues, Hypertension, Anxiety, and Diabetes (Managed by [facility] ) ? ? ?HPI ?Type 2 diabetes mellitus ?Sees endocrinology, Patient comes in today for recheck of his diabetes. Patient has been currently taking NovoLog pump. Patient is currently on an ACE inhibitor/ARB. Patient has not seen an ophthalmologist this year. Patient denies any issues with their feet. The symptom started onset as an adult hypertension ARE RELATED TO DM  ? ?Hypertension ?Patient is currently on lisinopril, and their blood pressure today is 104/64. Patient denies any lightheadedness or dizziness. Patient denies headaches, blurred vision, chest pains, shortness of breath, or weakness. Denies any side effects from medication and is content with current medication.  ? ?Pain and anxiety assessment: ?Cause of pain-bilateral shoulder osteoarthritis ?Pain location-both shoulders ?Pain on scale of 1-10- 3 ?Frequency-every now and then it flares up, sometimes once or twice a month ?What increases pain-certain movements or overactivity ?What makes pain Better-rest and occasionally takes Tylenol 3 ?Effects on ADL -minimal ?Any change in general medical condition-none ? ?Current rx-Tylenol 3 every 4 hours as needed, 45 and oxazepam 15 mg nightly, 30 ?# meds rx-45 and 30 ?Effectiveness of current meds-works well ?Adverse reactions from pain meds-none ?Morphine equivalent-N/A ? ?Pill count performed-No ?Last drug screen -11/25/2020 ?( high risk q3m, moderate risk q49m, low risk yearly ) ?Urine drug screen today- Yes ?Was the [place] reviewed-yes ? If yes were their any concerning findings? -None ? ?Pain contract signed on: Today ? ?Relevant past  medical, surgical, family and social history reviewed and updated as indicated. Interim medical history since our last visit reviewed. ?Allergies and medications reviewed and updated. ? ?Review of Systems  ?Constitutional:  Negative for chills and fever.  ?Eyes:  Negative for redness and visual disturbance.  ?Respiratory:  Negative for chest tightness and shortness of breath.   ?Cardiovascular:  Negative for chest pain and leg swelling.  ?Genitourinary:  Negative for difficulty urinating and dysuria.  ?Musculoskeletal:  Positive for arthralgias. Negative for back pain and gait problem.  ?Skin:  Negative for rash.  ?Neurological:  Negative for light-headedness and headaches.  ?Psychiatric/Behavioral:  Positive for sleep disturbance. Negative for agitation and behavioral problems. The patient is nervous/anxious.   ?All other systems reviewed and are negative. ? ?Per HPI unless specifically indicated above ? ? ?Allergies as of 10/04/2021   ? ?   Reactions  ? Aspirin   ? States is not suppose to take due to eye hemorrhages.   ? ?  ? ?  ?Medication List  ?  ? ?  ? Accurate as of October 04, 2021  3:36 PM. If you have any questions, ask your nurse or doctor.  ?  ?  ? ?  ? ?acetaminophen 500 MG tablet ?Commonly known as: TYLENOL ?Take 500 mg by mouth every 6 (six) hours as needed. ?  ?acetaminophen-codeine 300-30 MG tablet ?Commonly known as: TYLENOL #3 ?every 4 (four) hours as needed. ?  ?Biotin 10000 MCG Tabs ?Take by mouth daily. ?  ?clotrimazole-betamethasone cream ?Commonly known as: LOTRISONE ?Apply topically 2 (two) times daily. ?  ?DULoxetine  30 MG capsule ?Commonly known as: CYMBALTA ?Take 1 capsule (30 mg total) by mouth daily. ?  ?famotidine 20 MG tablet ?Commonly known as: PEPCID ?Take 1 tablet (20 mg total) by mouth 2 (two) times daily. ?  ?glucose blood test strip ?Commonly known as: Contour Next Test ?Test BS 3 times daily ?  ?hydrochlorothiazide 25 MG tablet ?Commonly known as: HYDRODIURIL ?Take 25 mg by mouth  daily. ?  ?insulin aspart 100 UNIT/ML injection ?Commonly known as: novoLOG ?Use approximately 80 units daily via Insulin Pump  DX E10.9 and Z96.41 ?  ?lisinopril 10 MG tablet ?Commonly known as: ZESTRIL ?Take 1 tablet (10 mg total) by mouth daily. ?  ?omeprazole 20 MG capsule ?Commonly known as: PRILOSEC ?Take 1 capsule (20 mg total) by mouth daily. ?  ?oxazepam 15 MG capsule ?Commonly known as: SERAX ?Take 1 capsule (15 mg total) by mouth daily as needed for sleep. ?  ?TRUEplus Lancets 33G Misc ?Test blood sugars 3 times daily ?  ?VITAMIN C PO ?Take by mouth. ?  ?Vitamin D (Ergocalciferol) 1.25 MG (50000 UNIT) Caps capsule ?Commonly known as: DRISDOL ?Take 1 capsule (50,000 Units total) by mouth every 7 (seven) days. ?  ?vitamin E 180 MG (400 UNITS) capsule ?Take by mouth. ?  ? ?  ? ? ? ?Objective:  ? ?BP 104/64   Pulse 91   Ht _0  (1.626 m)   Wt 240 lb (108.9 kg)   LMP 03/24/2013   SpO2 98%   BMI 41.20 kg/m?   ?Wt Readings from Last 3 Encounters:  ?10/04/21 240 lb (108.9 kg)  ?07/05/21 238 lb (108 kg)  ?02/22/21 233 lb (105.7 kg)  ?  ?Physical Exam ?Vitals and nursing note reviewed.  ?Constitutional:   ?   General: She is not in acute distress. ?   Appearance: She is well-developed. She is not diaphoretic.  ?Eyes:  ?   Conjunctiva/sclera: Conjunctivae normal.  ?Cardiovascular:  ?   Rate and Rhythm: Normal rate and regular rhythm.  ?   Heart sounds: Normal heart sounds. No murmur heard. ?Pulmonary:  ?   Effort: Pulmonary effort is normal. No respiratory distress.  ?   Breath sounds: Normal breath sounds. No wheezing.  ?Musculoskeletal:     ?   General: No swelling or tenderness. Normal range of motion.  ?Skin: ?   General: Skin is warm and dry.  ?   Findings: No rash.  ?Neurological:  ?   Mental Status: She is alert and oriented to person, place, and time.  ?   Coordination: Coordination normal.  ?Psychiatric:     ?   Mood and Affect: Mood is anxious. Mood is not depressed.     ?   Behavior: Behavior  normal.  ? ? ? ? ?Assessment & Plan:  ? ?Problem List Items Addressed This Visit   ? ?  ? Cardiovascular and Mediastinum  ? Hypertension associated with diabetes ([place])  ? Relevant Orders  ? CBC with Differential/Platelet  ? CMP14+EGFR  ? Lipid panel  ? TSH  ? VITAMIN D 25 Hydroxy (Vit-D Deficiency, Fractures)  ? Bayer DCA Hb A1c Waived  ?  ? Endocrine  ? DM type 1 (diabetes mellitus, type 1) ([place])  ? Relevant Orders  ? CBC with Differential/Platelet  ? CMP14+EGFR  ? Lipid panel  ? TSH  ? VITAMIN D 25 Hydroxy (Vit-D Deficiency, Fractures)  ? Bayer DCA Hb A1c Waived  ? 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  Relevant Orders  ? CBC with Differential/Platelet  ? CMP14+EGFR  ? Lipid panel  ? TSH  ? VITAMIN D 25 Hydroxy (Vit-D Deficiency, Fractures)  ? Bayer DCA Hb A1c Waived  ?  ? Other  ? GAD (generalized anxiety disorder)  ? Relevant Medications  ? DULoxetine (CYMBALTA) 30 MG capsule  ? oxazepam (SERAX) 15 MG capsule  ? Vitamin D deficiency  ? Relevant Orders  ? CBC with Differential/Platelet  ? CMP14+EGFR  ? Lipid panel  ? TSH  ? VITAMIN D 25 Hydroxy (Vit-D Deficiency, Fractures)  ? Bayer DCA Hb A1c Waived  ? ?Other Visit Diagnoses   ? ? Controlled substance agreement signed    -  Primary  ? Relevant Orders  ? ToxASSURE Select 13 ([place]), Urine  ? Bilateral shoulder region arthritis      ? Relevant Medications  ? acetaminophen-codeine (TYLENOL #3) 300-30 MG tablet  ? ?  ?  ?Patient sees endocrinology for diabetes, has recently developed more CKD.  Increase hydration.  Recheck levels today. ? ?No change medication right now.  A1c looks good with endocrinology but kidney function was up so we will recheck those today. ?Follow up plan: ?Return in about 3 months (around 01/03/2022), or if symptoms worsen or fail to improve, for Anxiety and pain and diabetes. ? ?Counseling provided for all of the vaccine components ?Orders Placed This Encounter  ?Procedures  ? CBC with Differential/Platelet  ? CMP14+EGFR  ? Lipid  panel  ? TSH  ? VITAMIN D 25 Hydroxy (Vit-D Deficiency, Fractures)  ? Bayer DCA Hb A1c Waived  ? ToxASSURE Select 13 (MW), Urine  ? ? ?Caryl Pina, MD ?[place] ?10/04/2021, 3:36 PM ? ? ?

## 2021-10-05 LAB — CBC WITH DIFFERENTIAL/PLATELET
Basophils Absolute: 0.1 10*3/uL (ref 0.0–0.2)
Basos: 1 %
EOS (ABSOLUTE): 0.4 10*3/uL (ref 0.0–0.4)
Eos: 6 %
Hematocrit: 32.7 % — ABNORMAL LOW (ref 34.0–46.6)
Hemoglobin: 11.3 g/dL (ref 11.1–15.9)
Immature Grans (Abs): 0 10*3/uL (ref 0.0–0.1)
Immature Granulocytes: 0 %
Lymphocytes Absolute: 2.5 10*3/uL (ref 0.7–3.1)
Lymphs: 32 %
MCH: 30.5 pg (ref 26.6–33.0)
MCHC: 34.6 g/dL (ref 31.5–35.7)
MCV: 88 fL (ref 79–97)
Monocytes Absolute: 0.6 10*3/uL (ref 0.1–0.9)
Monocytes: 7 %
Neutrophils Absolute: 4.2 10*3/uL (ref 1.4–7.0)
Neutrophils: 54 %
Platelets: 281 10*3/uL (ref 150–450)
RBC: 3.71 x10E6/uL — ABNORMAL LOW (ref 3.77–5.28)
RDW: 12.6 % (ref 11.7–15.4)
WBC: 7.9 10*3/uL (ref 3.4–10.8)

## 2021-10-05 LAB — CMP14+EGFR
ALT: 19 IU/L (ref 0–32)
AST: 21 IU/L (ref 0–40)
Albumin/Globulin Ratio: 1.9 (ref 1.2–2.2)
Albumin: 4.2 g/dL (ref 3.8–4.9)
Alkaline Phosphatase: 92 IU/L (ref 44–121)
BUN/Creatinine Ratio: 17 (ref 9–23)
BUN: 22 mg/dL (ref 6–24)
Bilirubin Total: 0.3 mg/dL (ref 0.0–1.2)
CO2: 22 mmol/L (ref 20–29)
Calcium: 9.4 mg/dL (ref 8.7–10.2)
Chloride: 100 mmol/L (ref 96–106)
Creatinine, Ser: 1.3 mg/dL — ABNORMAL HIGH (ref 0.57–1.00)
Globulin, Total: 2.2 g/dL (ref 1.5–4.5)
Glucose: 84 mg/dL (ref 70–99)
Potassium: 4.2 mmol/L (ref 3.5–5.2)
Sodium: 141 mmol/L (ref 134–144)
Total Protein: 6.4 g/dL (ref 6.0–8.5)
eGFR: 48 mL/min/{1.73_m2} — ABNORMAL LOW (ref >59)

## 2021-10-05 LAB — LIPID PANEL
Chol/HDL Ratio: 2 ratio (ref 0.0–4.4)
Cholesterol, Total: 137 mg/dL (ref 100–199)
HDL: 67 mg/dL (ref >39)
LDL Chol Calc (NIH): 52 mg/dL (ref 0–99)
Triglycerides: 99 mg/dL (ref 0–149)
VLDL Cholesterol Cal: 18 mg/dL (ref 5–40)

## 2021-10-05 LAB — VITAMIN D 25 HYDROXY (VIT D DEFICIENCY, FRACTURES): Vit D, 25-Hydroxy: 45.1 ng/mL (ref 30.0–100.0)

## 2021-10-05 LAB — TSH: TSH: 2.17 u[IU]/mL (ref 0.450–4.500)

## 2021-10-11 LAB — TOXASSURE SELECT 13 (MW), URINE

## 2021-11-09 DIAGNOSIS — Z9641 Presence of insulin pump (external) (internal): Secondary | ICD-10-CM | POA: Diagnosis not present

## 2021-11-09 DIAGNOSIS — E103553 Type 1 diabetes mellitus with stable proliferative diabetic retinopathy, bilateral: Secondary | ICD-10-CM | POA: Diagnosis not present

## 2021-11-09 DIAGNOSIS — E103513 Type 1 diabetes mellitus with proliferative diabetic retinopathy with macular edema, bilateral: Secondary | ICD-10-CM | POA: Diagnosis not present

## 2021-11-09 DIAGNOSIS — E10649 Type 1 diabetes mellitus with hypoglycemia without coma: Secondary | ICD-10-CM | POA: Diagnosis not present

## 2021-11-09 DIAGNOSIS — Z6841 Body Mass Index (BMI) 40.0 and over, adult: Secondary | ICD-10-CM | POA: Diagnosis not present

## 2021-12-26 NOTE — Patient Instructions (Signed)
Our records indicate that you are due for your annual mammogram/breast imaging. While there is no way to prevent breast cancer, early detection provides the best opportunity for curing it. For women over the age of 40, the American Cancer Society recommends a yearly clinical breast exam and a yearly mammogram. These practices have saved thousands of lives. We need your help to ensure that you are receiving optimal medical care. Please call the imaging location that has done you previous mammograms. Please remember to list us as your primary care. This helps make sure we receive a report and can update your chart.  Below is the contact information for several local breast imaging centers. You may call the location that works best for you, and they will be happy to assistance in making you an appointment. You do not need an order for a regular screening mammogram. However, if you are having any problems or concerns with you breast area, please let your primary care provider know, and appropriate orders will be placed. Please let our office know if you have any questions or concerns. Or if you need information for another imaging center not on this list or outside of the area. We are commented to working with you on your health care journey.   The mobile unit/bus ([facility]) - they come twice a month to our location.  These appointments can be made through our office or by call The [facility]  [facility]  [address] [place], [place] [address] Phone [phone]  [facility] Radiology Department  [address]  [place], [place] [address] [phone]  [facility])  [address] [place], [place] [address] [phone]  [facility] [place]  [address] [place] [address] [phone]  [facility]  [address], [address] [place] [address] [phone]  [facility] [place]  [address] [place], [place] [address] [phone]  [facility] [address] [place], [place] [address] [phone]  [facility] [address]  [address] [place],  [address] [phone]  [facility] [address] [place] [address] [phone]     

## 2022-01-03 ENCOUNTER — Other Ambulatory Visit: Payer: Self-pay

## 2022-01-03 ENCOUNTER — Ambulatory Visit (INDEPENDENT_AMBULATORY_CARE_PROVIDER_SITE_OTHER): Payer: Medicare Other | Admitting: Family Medicine

## 2022-01-03 ENCOUNTER — Encounter: Payer: Self-pay | Admitting: Family Medicine

## 2022-01-03 VITALS — BP 121/66 | HR 101 | Temp 98.6°F | Ht 64.0 in | Wt 235.0 lb

## 2022-01-03 DIAGNOSIS — N183 Chronic kidney disease, stage 3 unspecified: Secondary | ICD-10-CM

## 2022-01-03 DIAGNOSIS — E1022 Type 1 diabetes mellitus with diabetic chronic kidney disease: Secondary | ICD-10-CM | POA: Diagnosis not present

## 2022-01-03 DIAGNOSIS — E559 Vitamin D deficiency, unspecified: Secondary | ICD-10-CM | POA: Diagnosis not present

## 2022-01-03 DIAGNOSIS — E103599 Type 1 diabetes mellitus with proliferative diabetic retinopathy without macular edema, unspecified eye: Secondary | ICD-10-CM

## 2022-01-03 DIAGNOSIS — F411 Generalized anxiety disorder: Secondary | ICD-10-CM

## 2022-01-03 MED ORDER — ACETAMINOPHEN-CODEINE 300-30 MG PO TABS: 1.0000 | ORAL_TABLET | Freq: Four times a day (QID) | ORAL | 0 refills | Status: DC | PRN

## 2022-01-03 MED ORDER — FAMOTIDINE 20 MG PO TABS: 20.0000 mg | ORAL_TABLET | Freq: Two times a day (BID) | ORAL | 3 refills | Status: DC

## 2022-01-03 MED ORDER — OMEPRAZOLE 20 MG PO CPDR: 20.0000 mg | DELAYED_RELEASE_CAPSULE | Freq: Every day | ORAL | 3 refills | Status: DC

## 2022-01-03 MED ORDER — CONTOUR NEXT TEST VI STRP
ORAL_STRIP | 3 refills | Status: AC
Start: 2022-01-03 — End: ?

## 2022-01-03 MED ORDER — TRUEPLUS LANCETS 33G MISC: 3 refills | Status: AC

## 2022-01-03 MED ORDER — VITAMIN D (ERGOCALCIFEROL) 1.25 MG (50000 UNIT) PO CAPS: 50000.0000 [IU] | ORAL_CAPSULE | ORAL | 1 refills | Status: DC

## 2022-01-03 MED ORDER — OXAZEPAM 15 MG PO CAPS: 15.0000 mg | ORAL_CAPSULE | Freq: Every day | ORAL | 2 refills | Status: DC | PRN

## 2022-01-03 NOTE — Progress Notes (Signed)
BP 121/66   Pulse (!) 101   Temp 98.6 F (37 C)   Ht _0  (1.626 m)   Wt 235 lb (106.6 kg)   LMP 03/24/2013   SpO2 100%   BMI 40.34 kg/m    Subjective:   [name], female    DOB: 1961-04-03, 61 y.o.   MRN: [id number]  HPI: Carol Jensen is a 61 y.o. female presenting on 01/03/2022 for Medical Management of Chronic Issues, Diabetes (Sees endocrinology- Steffanie Dunn), Anxiety, and Hypertension   HPI Anxiety and insomnia and pain Patient is coming in today for anxiety and insomnia.  Currently she takes oxazepam 15 mg nightly as needed Current rx-Tylenol 3 every 6 hours as needed, 45 for 81-monthcycle # meds rx-45 to 376-monthycle Effectiveness of current meds-works well Adverse reactions form meds-none  Pill count performed-No Last drug screen -10/11/2021 ( high risk q3m93moderate risk q6m,42mw risk yearly ) Urine drug screen today- No Was the NCCSEverettiewed-yes  If yes were their any concerning findings? -None  No flowsheet data found.   Controlled substance contract signed on: 10/11/2021  Relevant past medical, surgical, family and social history reviewed and updated as indicated. Interim medical history since our last visit reviewed. Allergies and medications reviewed and updated.  Review of Systems  Constitutional:  Negative for chills and fever.  Eyes:  Negative for visual disturbance.  Respiratory:  Negative for chest tightness and shortness of breath.   Cardiovascular:  Negative for chest pain and leg swelling.  Genitourinary:  Negative for difficulty urinating and dysuria.  Musculoskeletal:  Negative for back pain and gait problem.  Skin:  Negative for rash.  Neurological:  Negative for light-headedness and headaches.  Psychiatric/Behavioral:  Negative for agitation and behavioral problems.   All other systems reviewed and are negative.   Per HPI unless specifically indicated above   Allergies as of 01/03/2022       Reactions   Aspirin     States is not suppose to take due to eye hemorrhages.         Medication List        Accurate as of January 03, 2022  3:03 PM. If you have any questions, ask your nurse or doctor.          acetaminophen 500 MG tablet Commonly known as: TYLENOL Take 500 mg by mouth every 6 (six) hours as needed.   acetaminophen-codeine 300-30 MG tablet Commonly known as: TYLENOL #3 Take 1 tablet by mouth every 6 (six) hours as needed for moderate pain. What changed:  how much to take how to take this when to take this reasons to take this additional instructions Changed by: JoshFransisca Kaufmanntinger, MD   Biotin 10000 MCG Tabs Take by mouth daily.   clotrimazole-betamethasone cream Commonly known as: LOTRISONE Apply topically 2 (two) times daily.   Contour Next Test test strip Generic drug: glucose blood Test BS 3 times daily   DULoxetine 30 MG capsule Commonly known as: CYMBALTA Take 1 capsule (30 mg total) by mouth daily.   famotidine 20 MG tablet Commonly known as: PEPCID Take 1 tablet (20 mg total) by mouth 2 (two) times daily.   hydrochlorothiazide 25 MG tablet Commonly known as: HYDRODIURIL Take 25 mg by mouth daily.   insulin aspart 100 UNIT/ML injection Commonly known as: novoLOG Use approximately 80 units daily via Insulin Pump  DX E10.9 and Z96.41   lisinopril 10 MG tablet Commonly known as: ZESTRIL  Take 1 tablet (10 mg total) by mouth daily.   omeprazole 20 MG capsule Commonly known as: PRILOSEC Take 1 capsule (20 mg total) by mouth daily.   oxazepam 15 MG capsule Commonly known as: SERAX Take 1 capsule (15 mg total) by mouth daily as needed for sleep.   TRUEplus Lancets 33G Misc Test blood sugars 3 times daily   VITAMIN C PO Take by mouth.   Vitamin D (Ergocalciferol) 1.25 MG (50000 UNIT) Caps capsule Commonly known as: DRISDOL Take 1 capsule (50,000 Units total) by mouth every 7 (seven) days.   vitamin E 180 MG (400 UNITS) capsule Take by mouth.          Objective:   BP 121/66   Pulse (!) 101   Temp 98.6 F (37 C)   Ht _0  (1.626 m)   Wt 235 lb (106.6 kg)   LMP 03/24/2013   SpO2 100%   BMI 40.34 kg/m   Wt Readings from Last 3 Encounters:  01/03/22 235 lb (106.6 kg)  10/04/21 240 lb (108.9 kg)  07/05/21 238 lb (108 kg)    Physical Exam Vitals and nursing note reviewed.  Constitutional:      General: She is not in acute distress.    Appearance: She is well-developed. She is not diaphoretic.  Eyes:     Conjunctiva/sclera: Conjunctivae normal.  Cardiovascular:     Rate and Rhythm: Normal rate and regular rhythm.     Heart sounds: Normal heart sounds. No murmur heard. Pulmonary:     Effort: Pulmonary effort is normal. No respiratory distress.     Breath sounds: Normal breath sounds. No wheezing.  Musculoskeletal:        General: No swelling or tenderness. Normal range of motion.  Skin:    General: Skin is warm and dry.     Findings: No rash.  Neurological:     Mental Status: She is alert and oriented to person, place, and time.     Coordination: Coordination normal.  Psychiatric:        Behavior: Behavior normal.       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  Counseling  provided for all of the vaccine components Orders Placed This Encounter  Procedures   Bayer [place] Hb A1c Waived   CBC with Differential/Platelet   CMP14+EGFR   Lipid panel    Caryl Pina, MD [facility] 01/03/2022, 3:03 PM

## 2022-02-12 DIAGNOSIS — E611 Iron deficiency: Secondary | ICD-10-CM | POA: Diagnosis not present

## 2022-02-12 DIAGNOSIS — Z6841 Body Mass Index (BMI) 40.0 and over, adult: Secondary | ICD-10-CM | POA: Diagnosis not present

## 2022-02-12 DIAGNOSIS — E10649 Type 1 diabetes mellitus with hypoglycemia without coma: Secondary | ICD-10-CM | POA: Diagnosis not present

## 2022-02-12 DIAGNOSIS — E559 Vitamin D deficiency, unspecified: Secondary | ICD-10-CM | POA: Diagnosis not present

## 2022-02-12 DIAGNOSIS — E103513 Type 1 diabetes mellitus with proliferative diabetic retinopathy with macular edema, bilateral: Secondary | ICD-10-CM | POA: Diagnosis not present

## 2022-02-12 DIAGNOSIS — Z9641 Presence of insulin pump (external) (internal): Secondary | ICD-10-CM | POA: Diagnosis not present

## 2022-02-21 DIAGNOSIS — H25811 Combined forms of age-related cataract, right eye: Secondary | ICD-10-CM | POA: Diagnosis not present

## 2022-02-21 DIAGNOSIS — Z794 Long term (current) use of insulin: Secondary | ICD-10-CM | POA: Diagnosis not present

## 2022-02-21 DIAGNOSIS — E1136 Type 2 diabetes mellitus with diabetic cataract: Secondary | ICD-10-CM | POA: Diagnosis not present

## 2022-02-21 DIAGNOSIS — H31091 Other chorioretinal scars, right eye: Secondary | ICD-10-CM | POA: Diagnosis not present

## 2022-02-21 DIAGNOSIS — Z961 Presence of intraocular lens: Secondary | ICD-10-CM | POA: Diagnosis not present

## 2022-02-21 DIAGNOSIS — E113593 Type 2 diabetes mellitus with proliferative diabetic retinopathy without macular edema, bilateral: Secondary | ICD-10-CM | POA: Diagnosis not present

## 2022-04-05 ENCOUNTER — Ambulatory Visit (INDEPENDENT_AMBULATORY_CARE_PROVIDER_SITE_OTHER): Payer: Medicare Other | Admitting: Family Medicine

## 2022-04-05 ENCOUNTER — Encounter: Payer: Self-pay | Admitting: Family Medicine

## 2022-04-05 VITALS — BP 128/72 | HR 88 | Temp 97.3°F | Ht 64.0 in | Wt 238.0 lb

## 2022-04-05 DIAGNOSIS — I152 Hypertension secondary to endocrine disorders: Secondary | ICD-10-CM | POA: Diagnosis not present

## 2022-04-05 DIAGNOSIS — N183 Chronic kidney disease, stage 3 unspecified: Secondary | ICD-10-CM

## 2022-04-05 DIAGNOSIS — F411 Generalized anxiety disorder: Secondary | ICD-10-CM | POA: Diagnosis not present

## 2022-04-05 DIAGNOSIS — Z23 Encounter for immunization: Secondary | ICD-10-CM

## 2022-04-05 DIAGNOSIS — E1022 Type 1 diabetes mellitus with diabetic chronic kidney disease: Secondary | ICD-10-CM | POA: Diagnosis not present

## 2022-04-05 DIAGNOSIS — E1159 Type 2 diabetes mellitus with other circulatory complications: Secondary | ICD-10-CM

## 2022-04-05 DIAGNOSIS — E103599 Type 1 diabetes mellitus with proliferative diabetic retinopathy without macular edema, unspecified eye: Secondary | ICD-10-CM

## 2022-04-05 MED ORDER — ACETAMINOPHEN-CODEINE 300-30 MG PO TABS
1.0000 | ORAL_TABLET | Freq: Four times a day (QID) | ORAL | 0 refills | Status: DC | PRN
Start: 2022-04-05 — End: 2022-07-03

## 2022-04-05 MED ORDER — OXAZEPAM 15 MG PO CAPS: 15.0000 mg | ORAL_CAPSULE | Freq: Every day | ORAL | 2 refills | Status: DC | PRN

## 2022-04-05 NOTE — Progress Notes (Signed)
BP 128/72   Pulse 88   Temp (!) 97.3 F (36.3 C)   Ht _0  (1.626 m)   Wt 238 lb (108 kg)   LMP 03/24/2013   SpO2 99%   BMI 40.85 kg/m    Subjective:   [name], female    DOB: 1961-02-06, 61 y.o.   MRN: [id number]  HPI: Carol Jensen is a 61 y.o. female presenting on 04/05/2022 for Medical Management of Chronic Issues, Chronic Kidney Disease, Anxiety, Diabetes, and Hypertension   HPI Anxiety and osteoarthritis recheck Current rx-oxazepam 15 mg daily as needed and Tylenol 3 every 6 hours as needed # meds rx- 30 and 45 Effectiveness of current meds-works well Adverse reactions form meds-none  Pill count performed-No Last drug screen -10/11/2021 ( high risk q29m moderate risk q633mlow risk yearly ) Urine drug screen today- No Was the NCDumonteviewed-yes  If yes were their any concerning findings? -None  No flowsheet data found.   Controlled substance contract signed on: 10/11/2021  Diabetes and hypertension and CKD Patient's endocrinologist manages these for the most part.  She follows with them regularly.  We will recheck levels today.  Relevant past medical, surgical, family and social history reviewed and updated as indicated. Interim medical history since our last visit reviewed. Allergies and medications reviewed and updated.  Review of Systems  Constitutional:  Negative for chills and fever.  Eyes:  Negative for visual disturbance.  Respiratory:  Negative for chest tightness and shortness of breath.   Cardiovascular:  Negative for chest pain and leg swelling.  Genitourinary:  Negative for difficulty urinating and dysuria.  Musculoskeletal:  Negative for back pain and gait problem.  Skin:  Negative for rash.  Neurological:  Negative for dizziness, light-headedness and headaches.  Psychiatric/Behavioral:  Positive for dysphoric mood. Negative for agitation, behavioral problems, self-injury, sleep disturbance and suicidal ideas. The patient is  nervous/anxious.   All other systems reviewed and are negative.   Per HPI unless specifically indicated above   Allergies as of 04/05/2022       Reactions   Aspirin    States is not suppose to take due to eye hemorrhages.         Medication List        Accurate as of April 05, 2022 11:24 AM. If you have any questions, ask your nurse or doctor.          acetaminophen 500 MG tablet Commonly known as: TYLENOL Take 500 mg by mouth every 6 (six) hours as needed.   acetaminophen-codeine 300-30 MG tablet Commonly known as: TYLENOL #3 Take 1 tablet by mouth every 6 (six) hours as needed for moderate pain.   Biotin 10000 MCG Tabs Take by mouth daily.   clotrimazole-betamethasone cream Commonly known as: LOTRISONE Apply topically 2 (two) times daily.   Contour Next Test test strip Generic drug: glucose blood Test BS 3 times daily   DULoxetine 30 MG capsule Commonly known as: CYMBALTA Take 1 capsule (30 mg total) by mouth daily.   famotidine 20 MG tablet Commonly known as: PEPCID Take 1 tablet (20 mg total) by mouth 2 (two) times daily.   hydrochlorothiazide 25 MG tablet Commonly known as: HYDRODIURIL Take 25 mg by mouth daily.   insulin aspart 100 UNIT/ML injection Commonly known as: novoLOG Use approximately 80 units daily via Insulin Pump  DX E10.9 and Z96.41   lisinopril 10 MG tablet Commonly known as: ZESTRIL Take 1 tablet (10 mg  total) by mouth daily.   omeprazole 20 MG capsule Commonly known as: PRILOSEC Take 1 capsule (20 mg total) by mouth daily.   oxazepam 15 MG capsule Commonly known as: SERAX Take 1 capsule (15 mg total) by mouth daily as needed for sleep.   TRUEplus Lancets 33G Misc Test blood sugars 3 times daily   VITAMIN C PO Take by mouth.   Vitamin D (Ergocalciferol) 1.25 MG (50000 UNIT) Caps capsule Commonly known as: DRISDOL Take 1 capsule (50,000 Units total) by mouth every 7 (seven) days.   vitamin E 180 MG (400 UNITS)  capsule Take by mouth.         Objective:   BP 128/72   Pulse 88   Temp (!) 97.3 F (36.3 C)   Ht _0  (1.626 m)   Wt 238 lb (108 kg)   LMP 03/24/2013   SpO2 99%   BMI 40.85 kg/m   Wt Readings from Last 3 Encounters:  04/05/22 238 lb (108 kg)  01/03/22 235 lb (106.6 kg)  10/04/21 240 lb (108.9 kg)    Physical Exam Vitals and nursing note reviewed.  Constitutional:      General: She is not in acute distress.    Appearance: She is well-developed. She is not diaphoretic.  Eyes:     Conjunctiva/sclera: Conjunctivae normal.  Cardiovascular:     Rate and Rhythm: Normal rate and regular rhythm.     Heart sounds: Normal heart sounds. No murmur heard. Pulmonary:     Effort: Pulmonary effort is normal. No respiratory distress.     Breath sounds: Normal breath sounds. No wheezing.  Musculoskeletal:        General: No tenderness. Normal range of motion.  Skin:    General: Skin is warm and dry.     Findings: No rash.  Neurological:     Mental Status: She is alert and oriented to person, place, and time.     Coordination: Coordination normal.  Psychiatric:        Behavior: Behavior normal.       Assessment & Plan:   Problem List Items Addressed This Visit       Cardiovascular and Mediastinum   Hypertension associated with diabetes ([place])   Relevant Orders   CBC with Differential/Platelet   CMP14+EGFR   Lipid panel   CBC with Differential/Platelet   CMP14+EGFR   Lipid panel   TSH     Endocrine   DM type 1 (diabetes mellitus, type 1) (HCC) - Primary   Relevant Orders   CBC with Differential/Platelet   CMP14+EGFR   Lipid panel   CBC with Differential/Platelet   CMP14+EGFR   Lipid panel   TSH   Proliferative diabetic retinopathy associated with type 1 diabetes mellitus (HCC)   Relevant Orders   CBC with Differential/Platelet   CMP14+EGFR   Lipid panel   TSH   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 Follow up plan: Return in about 3 months (around 07/06/2022), or if symptoms worsen or fail to improve, for Anxiety and diabetes and hypertension recheck.  Counseling provided for all of the vaccine components Orders Placed This Encounter  Procedures   CBC with Differential/Platelet   CMP14+EGFR   Lipid panel   CBC with Differential/Platelet   CMP14+EGFR   Lipid panel   TSH    Caryl Pina, MD [facility] 04/05/2022, 11:24 AM

## 2022-04-06 LAB — CMP14+EGFR
ALT: 14 IU/L (ref 0–32)
AST: 19 IU/L (ref 0–40)
Albumin/Globulin Ratio: 1.6 (ref 1.2–2.2)
Albumin: 4.1 g/dL (ref 3.8–4.9)
Alkaline Phosphatase: 82 IU/L (ref 44–121)
BUN/Creatinine Ratio: 21 (ref 9–23)
BUN: 29 mg/dL — ABNORMAL HIGH (ref 6–24)
Bilirubin Total: 0.4 mg/dL (ref 0.0–1.2)
CO2: 24 mmol/L (ref 20–29)
Calcium: 9.6 mg/dL (ref 8.7–10.2)
Chloride: 102 mmol/L (ref 96–106)
Creatinine, Ser: 1.35 mg/dL — ABNORMAL HIGH (ref 0.57–1.00)
Globulin, Total: 2.6 g/dL (ref 1.5–4.5)
Glucose: 130 mg/dL — ABNORMAL HIGH (ref 70–99)
Potassium: 4.7 mmol/L (ref 3.5–5.2)
Sodium: 138 mmol/L (ref 134–144)
Total Protein: 6.7 g/dL (ref 6.0–8.5)
eGFR: 45 mL/min/{1.73_m2} — ABNORMAL LOW (ref >59)

## 2022-04-06 LAB — CBC WITH DIFFERENTIAL/PLATELET
Basophils Absolute: 0.1 10*3/uL (ref 0.0–0.2)
Basos: 1 %
EOS (ABSOLUTE): 0.3 10*3/uL (ref 0.0–0.4)
Eos: 5 %
Hematocrit: 33.5 % — ABNORMAL LOW (ref 34.0–46.6)
Hemoglobin: 11 g/dL — ABNORMAL LOW (ref 11.1–15.9)
Immature Grans (Abs): 0 10*3/uL (ref 0.0–0.1)
Immature Granulocytes: 0 %
Lymphocytes Absolute: 1.8 10*3/uL (ref 0.7–3.1)
Lymphs: 26 %
MCH: 29.4 pg (ref 26.6–33.0)
MCHC: 32.8 g/dL (ref 31.5–35.7)
MCV: 90 fL (ref 79–97)
Monocytes Absolute: 0.4 10*3/uL (ref 0.1–0.9)
Monocytes: 7 %
Neutrophils Absolute: 4.1 10*3/uL (ref 1.4–7.0)
Neutrophils: 61 %
Platelets: 292 10*3/uL (ref 150–450)
RBC: 3.74 x10E6/uL — ABNORMAL LOW (ref 3.77–5.28)
RDW: 12.2 % (ref 11.7–15.4)
WBC: 6.7 10*3/uL (ref 3.4–10.8)

## 2022-04-06 LAB — TSH: TSH: 1.18 u[IU]/mL (ref 0.450–4.500)

## 2022-04-06 LAB — LIPID PANEL
Chol/HDL Ratio: 1.5 ratio (ref 0.0–4.4)
Cholesterol, Total: 127 mg/dL (ref 100–199)
HDL: 84 mg/dL (ref >39)
LDL Chol Calc (NIH): 33 mg/dL (ref 0–99)
Triglycerides: 41 mg/dL (ref 0–149)
VLDL Cholesterol Cal: 10 mg/dL (ref 5–40)

## 2022-05-07 ENCOUNTER — Other Ambulatory Visit: Payer: Self-pay | Admitting: Family Medicine

## 2022-05-24 ENCOUNTER — Ambulatory Visit (INDEPENDENT_AMBULATORY_CARE_PROVIDER_SITE_OTHER): Payer: Medicare Other | Admitting: Family Medicine

## 2022-05-24 ENCOUNTER — Encounter: Payer: Self-pay | Admitting: Family Medicine

## 2022-05-24 DIAGNOSIS — R0981 Nasal congestion: Secondary | ICD-10-CM | POA: Diagnosis not present

## 2022-05-24 MED ORDER — BENZONATATE 100 MG PO CAPS: 100.0000 mg | ORAL_CAPSULE | Freq: Three times a day (TID) | ORAL | 0 refills | Status: DC | PRN

## 2022-05-24 MED ORDER — AMOXICILLIN 500 MG PO CAPS: 500.0000 mg | ORAL_CAPSULE | Freq: Two times a day (BID) | ORAL | 0 refills | Status: DC

## 2022-05-24 MED ORDER — FLUTICASONE PROPIONATE 50 MCG/ACT NA SUSP: 1.0000 | Freq: Two times a day (BID) | NASAL | 6 refills | Status: DC | PRN

## 2022-05-24 NOTE — Progress Notes (Signed)
Virtual Visit via telephone Note  I connected with Carol Jensen on 05/24/22 at 1503 by telephone and verified that I am speaking with the correct person using two identifiers. Carol Jensen is currently located at home and patient are currently with her during visit. The provider, Fransisca Kaufmann Favio Moder, MD is located in their office at time of visit.  Call ended at 1512  I discussed the limitations, risks, security and privacy concerns of performing an evaluation and management service by telephone and the availability of in person appointments. I also discussed with the patient that there may be a patient responsible charge related to this service. The patient expressed understanding and agreed to proceed.   History and Present Illness: Patient is calling in for 3 days of headache and chills and sore throat. She took cough syrup and lozenges and throat is better. Nasal congestion and sinus pressure and nighttime cough.  Her boyfriend was sick a few weeks ago. She has a lot of reactive coughing.  She is up today trying to move around more.  She is not having chills today but does feel achy. She denies sick contacts that she knows of except him.   1. Sinus congestion     Outpatient Encounter Medications as of 05/24/2022  Medication Sig   amoxicillin (AMOXIL) 500 MG capsule Take 1 capsule (500 mg total) by mouth 2 (two) times daily.   benzonatate (TESSALON PERLES) 100 MG capsule Take 1 capsule (100 mg total) by mouth 3 (three) times daily as needed for cough.   fluticasone (FLONASE) 50 MCG/ACT nasal spray Place 1 spray into both nostrils 2 (two) times daily as needed for allergies or rhinitis.   acetaminophen (TYLENOL) 500 MG tablet Take 500 mg by mouth every 6 (six) hours as needed.   acetaminophen-codeine (TYLENOL #3) 300-30 MG tablet Take 1 tablet by mouth every 6 (six) hours as needed for moderate pain.   Ascorbic Acid (VITAMIN C PO) Take by mouth.   Biotin 10000 MCG TABS Take by mouth daily.    clotrimazole-betamethasone (LOTRISONE) cream Apply topically 2 (two) times daily.   DULoxetine (CYMBALTA) 30 MG capsule Take 1 capsule (30 mg total) by mouth daily.   famotidine (PEPCID) 20 MG tablet Take 1 tablet by mouth twice daily   glucose blood (CONTOUR NEXT TEST) test strip Test BS 3 times daily   hydrochlorothiazide (HYDRODIURIL) 25 MG tablet Take 25 mg by mouth daily.   insulin aspart (NOVOLOG) 100 UNIT/ML injection Use approximately 80 units daily via Insulin Pump  DX E10.9 and Z96.41   lisinopril (PRINIVIL,ZESTRIL) 10 MG tablet Take 1 tablet (10 mg total) by mouth daily.   omeprazole (PRILOSEC) 20 MG capsule Take 1 capsule (20 mg total) by mouth daily.   oxazepam (SERAX) 15 MG capsule Take 1 capsule (15 mg total) by mouth daily as needed for sleep.   TRUEplus Lancets 33G MISC Test blood sugars 3 times daily   Vitamin D, Ergocalciferol, (DRISDOL) 1.25 MG (50000 UNIT) CAPS capsule Take 1 capsule (50,000 Units total) by mouth every 7 (seven) days.   vitamin E 180 MG (400 UNITS) capsule Take by mouth.   No facility-administered encounter medications on file as of 05/24/2022.    Review of Systems  Constitutional:  Negative for chills and fever.  HENT:  Positive for congestion, postnasal drip, rhinorrhea, sinus pressure, sneezing and sore throat. Negative for ear discharge and ear pain.   Eyes:  Negative for pain, redness and visual disturbance.  Respiratory:  Positive for cough. Negative for chest tightness and shortness of breath.   Cardiovascular:  Negative for chest pain and leg swelling.  Genitourinary:  Negative for difficulty urinating and dysuria.  Musculoskeletal:  Negative for back pain and gait problem.  Skin:  Negative for rash.  Neurological:  Negative for light-headedness and headaches.  Psychiatric/Behavioral:  Negative for agitation and behavioral problems.   All other systems reviewed and are negative.   Observations/Objective: Patient sounds comfortable and in  no acute distress  Assessment and Plan: Problem List Items Addressed This Visit   None Visit Diagnoses     Sinus congestion    -  Primary   Relevant Medications   fluticasone (FLONASE) 50 MCG/ACT nasal spray   amoxicillin (AMOXIL) 500 MG capsule   benzonatate (TESSALON PERLES) 100 MG capsule   Other Relevant Orders   Novel Coronavirus, NAA ([facility])       Will do COVID testing, will give Flonase and amoxicillin and Tessalon Perles Follow up plan: Return if symptoms worsen or fail to improve.     I discussed the assessment and treatment plan with the patient. The patient was provided an opportunity to ask questions and all were answered. The patient agreed with the plan and demonstrated an understanding of the instructions.   The patient was advised to call back or seek an in-person evaluation if the symptoms worsen or if the condition fails to improve as anticipated.  The above assessment and management plan was discussed with the patient. The patient verbalized understanding of and has agreed to the management plan. Patient is aware to call the clinic if symptoms persist or worsen. Patient is aware when to return to the clinic for a follow-up visit. Patient educated on when it is appropriate to go to the emergency department.    I provided 9 minutes of non-face-to-face time during this encounter.    Carol Pyle, MD

## 2022-05-25 LAB — NOVEL CORONAVIRUS, NAA: SARS-CoV-2, NAA: NOT DETECTED

## 2022-06-04 DIAGNOSIS — E10649 Type 1 diabetes mellitus with hypoglycemia without coma: Secondary | ICD-10-CM | POA: Diagnosis not present

## 2022-06-04 DIAGNOSIS — Z9641 Presence of insulin pump (external) (internal): Secondary | ICD-10-CM | POA: Diagnosis not present

## 2022-06-04 DIAGNOSIS — E103513 Type 1 diabetes mellitus with proliferative diabetic retinopathy with macular edema, bilateral: Secondary | ICD-10-CM | POA: Diagnosis not present

## 2022-06-04 DIAGNOSIS — Z6841 Body Mass Index (BMI) 40.0 and over, adult: Secondary | ICD-10-CM | POA: Diagnosis not present

## 2022-07-03 ENCOUNTER — Encounter: Payer: Self-pay | Admitting: Family Medicine

## 2022-07-06 ENCOUNTER — Ambulatory Visit (INDEPENDENT_AMBULATORY_CARE_PROVIDER_SITE_OTHER): Payer: Medicare Other | Admitting: Family Medicine

## 2022-07-06 ENCOUNTER — Encounter: Payer: Self-pay | Admitting: Family Medicine

## 2022-07-06 VITALS — BP 121/65 | HR 90 | Temp 97.5°F | Ht 64.0 in | Wt 235.0 lb

## 2022-07-06 DIAGNOSIS — E559 Vitamin D deficiency, unspecified: Secondary | ICD-10-CM | POA: Diagnosis not present

## 2022-07-06 DIAGNOSIS — Z794 Long term (current) use of insulin: Secondary | ICD-10-CM | POA: Diagnosis not present

## 2022-07-06 DIAGNOSIS — E103599 Type 1 diabetes mellitus with proliferative diabetic retinopathy without macular edema, unspecified eye: Secondary | ICD-10-CM | POA: Diagnosis not present

## 2022-07-06 DIAGNOSIS — E1059 Type 1 diabetes mellitus with other circulatory complications: Secondary | ICD-10-CM

## 2022-07-06 DIAGNOSIS — F411 Generalized anxiety disorder: Secondary | ICD-10-CM

## 2022-07-06 DIAGNOSIS — I152 Hypertension secondary to endocrine disorders: Secondary | ICD-10-CM

## 2022-07-06 DIAGNOSIS — E1159 Type 2 diabetes mellitus with other circulatory complications: Secondary | ICD-10-CM

## 2022-07-06 MED ORDER — VITAMIN D (ERGOCALCIFEROL) 1.25 MG (50000 UNIT) PO CAPS: 50000.0000 [IU] | ORAL_CAPSULE | ORAL | 1 refills | Status: DC

## 2022-07-06 MED ORDER — OXAZEPAM 15 MG PO CAPS: 15.0000 mg | ORAL_CAPSULE | Freq: Every day | ORAL | 2 refills | Status: DC | PRN

## 2022-07-06 MED ORDER — ACETAMINOPHEN-CODEINE 300-30 MG PO TABS: 1.0000 | ORAL_TABLET | Freq: Four times a day (QID) | ORAL | 0 refills | Status: DC | PRN

## 2022-07-06 MED ORDER — FAMOTIDINE 20 MG PO TABS: 20.0000 mg | ORAL_TABLET | Freq: Two times a day (BID) | ORAL | 3 refills | Status: DC

## 2022-07-06 NOTE — Progress Notes (Signed)
BP 121/65   Pulse 90   Temp (!) 97.5 F (36.4 C)   Ht 5\' 4"  (1.626 m)   Wt 235 lb (106.6 kg)   LMP 03/24/2013   SpO2 99%   BMI 40.34 kg/m    Subjective:   Patient ID: 05/24/2013, female    DOB: 1961-02-14, 62 y.o.   MRN: [id number]  HPI: Carol Jensen is a 62 y.o. female presenting on 07/06/2022 for Medical Management of Chronic Issues, Anxiety, Hypertension, and Diabetes   HPI Anxiety and arthritic pain Current rx-Tylenol 3 every 6 hours as needed and oxazepam 15 mg nightly as needed # meds rx-45 for 77-month cycle and 56-month Effectiveness of current meds-works well, does not use the Tylenol 3 consistently, uses the oxazepam almost daily. Adverse reactions form meds-none  Pill count performed-No Last drug screen -10/11/2021 ( high risk q65m, moderate risk q34m, low risk yearly ) Urine drug screen today- No Was the NCCSR reviewed-yes  If yes were their any concerning findings? -No  No flowsheet data found.   Controlled substance contract signed on: 10/11/2021  Type 1 diabetes mellitus Patient comes in today for recheck of his diabetes. Patient has been currently taking insulin pump. Patient is currently on an ACE inhibitor/ARB. Patient has not seen an ophthalmologist this year. Patient denies any issues with their feet. The symptom started onset as an adult hypertension and CKD and retinopathy ARE RELATED TO DM, sees endocrinology for this  Hypertension Patient is currently on lisinopril and hydrochlorothiazide, and their blood pressure today is 121/65. Patient denies any lightheadedness or dizziness. Patient denies headaches, blurred vision, chest pains, shortness of breath, or weakness. Denies any side effects from medication and is content with current medication.   Relevant past medical, surgical, family and social history reviewed and updated as indicated. Interim medical history since our last visit reviewed. Allergies and medications reviewed and updated.  Review  of Systems  Constitutional:  Negative for chills and fever.  Eyes:  Negative for visual disturbance.  Respiratory:  Negative for chest tightness and shortness of breath.   Cardiovascular:  Negative for chest pain and leg swelling.  Musculoskeletal:  Negative for back pain and gait problem.  Skin:  Negative for rash.  Neurological:  Negative for dizziness, light-headedness and headaches.  Psychiatric/Behavioral:  Negative for agitation and behavioral problems.   All other systems reviewed and are negative.   Per HPI unless specifically indicated above   Allergies as of 07/06/2022       Reactions   Aspirin    States is not suppose to take due to eye hemorrhages.         Medication List        Accurate as of July 06, 2022 11:13 AM. If you have any questions, ask your nurse or doctor.          acetaminophen 500 MG tablet Commonly known as: TYLENOL Take 500 mg by mouth every 6 (six) hours as needed.   acetaminophen-codeine 300-30 MG tablet Commonly known as: TYLENOL #3 Take 1 tablet by mouth every 6 (six) hours as needed for moderate pain.   amoxicillin 500 MG capsule Commonly known as: AMOXIL Take 1 capsule (500 mg total) by mouth 2 (two) times daily.   benzonatate 100 MG capsule Commonly known as: Tessalon Perles Take 1 capsule (100 mg total) by mouth 3 (three) times daily as needed for cough.   Biotin July 08, 2022 MCG Tabs Take by mouth daily.   clotrimazole-betamethasone cream  Commonly known as: LOTRISONE Apply topically 2 (two) times daily.   Contour Next Test test strip Generic drug: glucose blood Test BS 3 times daily   DULoxetine 30 MG capsule Commonly known as: CYMBALTA Take 1 capsule (30 mg total) by mouth daily.   famotidine 20 MG tablet Commonly known as: PEPCID Take 1 tablet by mouth twice daily   fluticasone 50 MCG/ACT nasal spray Commonly known as: FLONASE Place 1 spray into both nostrils 2 (two) times daily as needed for allergies or  rhinitis.   hydrochlorothiazide 25 MG tablet Commonly known as: HYDRODIURIL Take 25 mg by mouth daily.   insulin aspart 100 UNIT/ML injection Commonly known as: novoLOG Use approximately 80 units daily via Insulin Pump  DX E10.9 and Z96.41   lisinopril 10 MG tablet Commonly known as: ZESTRIL Take 1 tablet (10 mg total) by mouth daily.   omeprazole 20 MG capsule Commonly known as: PRILOSEC Take 1 capsule (20 mg total) by mouth daily.   oxazepam 15 MG capsule Commonly known as: SERAX Take 1 capsule (15 mg total) by mouth daily as needed for sleep.   TRUEplus Lancets 33G Misc Test blood sugars 3 times daily   VITAMIN C PO Take by mouth.   Vitamin D (Ergocalciferol) 1.25 MG (50000 UNIT) Caps capsule Commonly known as: DRISDOL Take 1 capsule (50,000 Units total) by mouth every 7 (seven) days.   vitamin E 180 MG (400 UNITS) capsule Take by mouth.         Objective:   BP 121/65   Pulse 90   Temp (!) 97.5 F (36.4 C)   Ht 5\' 4"  (1.626 m)   Wt 235 lb (106.6 kg)   LMP 03/24/2013   SpO2 99%   BMI 40.34 kg/m   Wt Readings from Last 3 Encounters:  07/06/22 235 lb (106.6 kg)  04/05/22 238 lb (108 kg)  01/03/22 235 lb (106.6 kg)    Physical Exam Vitals and nursing note reviewed.  Constitutional:      General: She is not in acute distress.    Appearance: She is well-developed. She is not diaphoretic.  Eyes:     Conjunctiva/sclera: Conjunctivae normal.  Cardiovascular:     Rate and Rhythm: Normal rate and regular rhythm.     Heart sounds: Normal heart sounds. No murmur heard. Pulmonary:     Effort: Pulmonary effort is normal. No respiratory distress.     Breath sounds: Normal breath sounds. No wheezing.  Musculoskeletal:        General: No swelling or tenderness. Normal range of motion.  Skin:    General: Skin is warm and dry.     Findings: No rash.  Neurological:     Mental Status: She is alert and oriented to person, place, and time.     Coordination:  Coordination normal.  Psychiatric:        Behavior: Behavior normal.       Assessment & Plan:   Problem List Items Addressed This Visit       Cardiovascular and Mediastinum   Hypertension associated with diabetes ([place])     Endocrine   DM type 1 (diabetes mellitus, type 1) ([place]) - Primary   Proliferative diabetic retinopathy associated with type 1 diabetes mellitus ([place])     Other   GAD (generalized anxiety disorder)   Vitamin D deficiency    Continue current medicine, gets her diabetes and other things checked with her endocrinologist and it looked good the last time she had it  done at 6.4 A1c. Follow up plan: Return in about 3 months (around 10/05/2022), or if symptoms worsen or fail to improve, for Anxiety and diabetes recheck.  Counseling provided for all of the vaccine components No orders of the defined types were placed in this encounter.   Caryl Pina, MD [facility] 07/06/2022, 11:13 AM

## 2022-09-03 ENCOUNTER — Other Ambulatory Visit: Payer: Self-pay | Admitting: Family Medicine

## 2022-09-03 DIAGNOSIS — E559 Vitamin D deficiency, unspecified: Secondary | ICD-10-CM

## 2022-09-06 DIAGNOSIS — E10649 Type 1 diabetes mellitus with hypoglycemia without coma: Secondary | ICD-10-CM | POA: Diagnosis not present

## 2022-09-06 DIAGNOSIS — Z6841 Body Mass Index (BMI) 40.0 and over, adult: Secondary | ICD-10-CM | POA: Diagnosis not present

## 2022-09-06 DIAGNOSIS — Z9641 Presence of insulin pump (external) (internal): Secondary | ICD-10-CM | POA: Diagnosis not present

## 2022-09-06 DIAGNOSIS — E559 Vitamin D deficiency, unspecified: Secondary | ICD-10-CM | POA: Diagnosis not present

## 2022-09-06 DIAGNOSIS — E103513 Type 1 diabetes mellitus with proliferative diabetic retinopathy with macular edema, bilateral: Secondary | ICD-10-CM | POA: Diagnosis not present

## 2022-09-06 LAB — HEMOGLOBIN A1C: Hemoglobin A1C: 6.9

## 2022-09-18 ENCOUNTER — Ambulatory Visit (INDEPENDENT_AMBULATORY_CARE_PROVIDER_SITE_OTHER): Payer: Medicare Other | Admitting: Family Medicine

## 2022-09-18 ENCOUNTER — Encounter: Payer: Self-pay | Admitting: Family Medicine

## 2022-09-18 VITALS — BP 129/61 | HR 86 | Temp 97.5°F | Ht 64.0 in | Wt 231.0 lb

## 2022-09-18 DIAGNOSIS — Z20822 Contact with and (suspected) exposure to covid-19: Secondary | ICD-10-CM

## 2022-09-18 DIAGNOSIS — R058 Other specified cough: Secondary | ICD-10-CM | POA: Diagnosis not present

## 2022-09-18 MED ORDER — PROMETHAZINE-DM 6.25-15 MG/5ML PO SYRP: 5.0000 mL | ORAL_SOLUTION | Freq: Four times a day (QID) | ORAL | 0 refills | Status: AC | PRN

## 2022-09-18 MED ORDER — MOLNUPIRAVIR EUA 200MG CAPSULE: 4.0000 | ORAL_CAPSULE | Freq: Two times a day (BID) | ORAL | 0 refills | Status: AC

## 2022-09-18 NOTE — Progress Notes (Signed)
Subjective:  [name], female    DOB: 01-Sep-1960, 62 y.o.   MRN: [id number]  Patient Care Team: Dettinger, Fransisca Kaufmann, MD as PCP - General (Family Medicine)   Chief Complaint:  Chills, Cough, Nasal Congestion, loss smell and taste, and Headache   HPI: Carol Jensen is a 62 y.o. female presenting on 09/18/2022 for Chills, Cough, Nasal Congestion, loss smell and taste, and Headache  Pt presents today for evaluation of cough, congestion, loss of smell and taste, and headaches which started 5 days ago. States her s/o tested positive for COVID and she has been with him daily. She has been taking tylenol and increased her water intake. States she has improved but is still symptomatic. She reports her cough is keeping her up at night.    Relevant past medical, surgical, family, and social history reviewed and updated as indicated.  Allergies and medications reviewed and updated. Data reviewed: Chart in Epic.   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   HAND SURGERY Left    trigger finger   HYSTEROSCOPY WITH D & C N/A 10/20/2013   Procedure: DILATATION AND CURETTAGE /HYSTEROSCOPY;  Surgeon: Jonnie Kind, MD;  Location: [facility];  Service: Gynecology;  Laterality: N/A;   POLYPECTOMY N/A 10/20/2013   Procedure: POLYPECTOMY;  Surgeon: Jonnie Kind, MD;  Location: [facility];  Service: Gynecology;  Laterality: N/A;   right shoulder    2011, 2012   SHOULDER SURGERY Left 2002    Social History   Socioeconomic History   Marital status: Single    Spouse name: Not on file   Number of children: Not on file   Years of education: Not on file   Highest education level: Not on file  Occupational History   Not on file  Tobacco Use   Smoking status: Never   Smokeless tobacco: Never  Substance and Sexual Activity   Alcohol use: No   Drug use: No   Sexual activity: Not Currently    Birth control/protection: Post-menopausal  Other Topics Concern   Not on file  Social History Narrative   Not on file   Social Determinants of Health   Financial Resource Strain: Not on file  Food Insecurity: Not on file  Transportation Needs: Not on file  Physical Activity: Not on file  Stress: Not on file  Social Connections: Not on file  Intimate Partner Violence: Not on file    Outpatient Encounter Medications as of 09/18/2022  Medication Sig   acetaminophen (TYLENOL) 500 MG tablet Take 500 mg by mouth every 6 (six) hours as needed.   acetaminophen-codeine (TYLENOL #3) 300-30 MG tablet Take 1 tablet by mouth every 6 (six) hours as needed for moderate pain.   Ascorbic Acid (VITAMIN C PO) Take by mouth.   Biotin 10000  MCG TABS Take by mouth daily.   clotrimazole-betamethasone (LOTRISONE) cream Apply topically 2 (two) times daily.   DULoxetine (CYMBALTA) 30 MG capsule Take 1 capsule (30 mg total) by mouth daily.   famotidine (PEPCID) 20 MG tablet Take 1 tablet (20 mg total) by mouth 2 (two) times daily.   fluticasone (FLONASE) 50 MCG/ACT nasal spray Place 1 spray into both nostrils 2 (two) times daily as needed for allergies or rhinitis.   glucose blood (CONTOUR NEXT TEST) test strip Test BS 3 times daily   hydrochlorothiazide (HYDRODIURIL) 25 MG tablet Take 25 mg by mouth daily.   lisinopril (PRINIVIL,ZESTRIL) 10 MG tablet Take 1 tablet (10 mg total) by mouth daily.   molnupiravir EUA (LAGEVRIO) 200 mg CAPS capsule Take 4 capsules  (800 mg total) by mouth 2 (two) times daily for 5 days.   omeprazole (PRILOSEC) 20 MG capsule Take 1 capsule (20 mg total) by mouth daily.   oxazepam (SERAX) 15 MG capsule Take 1 capsule (15 mg total) by mouth daily as needed for sleep.   promethazine-dextromethorphan (PROMETHAZINE-DM) 6.25-15 MG/5ML syrup Take 5 mLs by mouth 4 (four) times daily as needed for cough.   TRUEplus Lancets 33G MISC Test blood sugars 3 times daily   Vitamin D, Ergocalciferol, (DRISDOL) 1.25 MG (50000 UNIT) CAPS capsule TAKE ONE CAPSULE BY MOUTH EVERY 7 DAYS.   vitamin E 180 MG (400 UNITS) capsule Take by mouth.   insulin aspart (NOVOLOG) 100 UNIT/ML injection Use approximately 80 units daily via Insulin Pump  DX E10.9 and Z96.41   [DISCONTINUED] amoxicillin (AMOXIL) 500 MG capsule Take 1 capsule (500 mg total) by mouth 2 (two) times daily.   [DISCONTINUED] benzonatate (TESSALON PERLES) 100 MG capsule Take 1 capsule (100 mg total) by mouth 3 (three) times daily as needed for cough.   No facility-administered encounter medications on file as of 09/18/2022.    Allergies  Allergen Reactions   Aspirin     States is not suppose to take due to eye hemorrhages.     Review of Systems  Constitutional:  Positive for activity change and chills. Negative for appetite change, diaphoresis, fatigue, fever and unexpected weight change.  HENT:  Positive for congestion. Negative for dental problem, drooling, ear discharge, ear pain, facial swelling, hearing loss, mouth sores, nosebleeds, postnasal drip, rhinorrhea, sinus pressure, sinus pain, sneezing, sore throat, tinnitus, trouble swallowing and voice change.   Eyes: Negative.  Negative for photophobia and visual disturbance.  Respiratory:  Positive for cough. Negative for apnea, choking, chest tightness, shortness of breath, wheezing and stridor.   Cardiovascular:  Negative for chest pain, palpitations and leg swelling.  Gastrointestinal:  Negative for abdominal pain, blood in  stool, constipation, diarrhea, nausea and vomiting.  Endocrine: Negative.   Genitourinary:  Negative for decreased urine volume, difficulty urinating, dysuria, frequency and urgency.  Musculoskeletal:  Negative for arthralgias and myalgias.  Skin: Negative.   Allergic/Immunologic: Negative.   Neurological:  Positive for headaches. Negative for dizziness, tremors, seizures, syncope, facial asymmetry, speech difficulty, weakness, light-headedness and numbness.  Hematological: Negative.   Psychiatric/Behavioral:  Negative for confusion, hallucinations, sleep disturbance and suicidal ideas.         Objective:  BP 129/61   Pulse 86   Temp (!) 97.5 F (36.4 C) (Temporal)   Ht 5\' 4"  (1.626 m)   Wt 231 lb (104.8 kg)   LMP 03/24/2013   SpO2 95%   BMI 39.65 kg/m    Wt Readings from Last 3 Encounters:  09/18/22  231 lb (104.8 kg)  07/06/22 235 lb (106.6 kg)  04/05/22 238 lb (108 kg)    Physical Exam Vitals and nursing note reviewed.  Constitutional:      General: She is not in acute distress.    Appearance: Normal appearance. She is well-developed and well-groomed. She is morbidly obese. She is not ill-appearing, toxic-appearing or diaphoretic.  HENT:     Head: Normocephalic and atraumatic.     Jaw: There is normal jaw occlusion.     Right Ear: Hearing, tympanic membrane, ear canal and external ear normal.     Left Ear: Hearing, tympanic membrane, ear canal and external ear normal.     Nose: Nose normal.     Mouth/Throat:     Lips: Pink.     Mouth: Mucous membranes are moist.     Pharynx: Oropharynx is clear. Uvula midline. Posterior oropharyngeal erythema present. No oropharyngeal exudate.  Eyes:     General: Lids are normal.     Extraocular Movements: Extraocular movements intact.     Conjunctiva/sclera: Conjunctivae normal.     Pupils: Pupils are equal, round, and reactive to light.  Neck:     Thyroid: No thyroid mass, thyromegaly or thyroid tenderness.     Vascular: No  carotid bruit or JVD.     Trachea: Trachea and phonation normal.  Cardiovascular:     Rate and Rhythm: Normal rate and regular rhythm.     Chest Wall: PMI is not displaced.     Pulses: Normal pulses.     Heart sounds: Normal heart sounds. No murmur heard.    No friction rub. No gallop.  Pulmonary:     Effort: Pulmonary effort is normal. No respiratory distress.     Breath sounds: Normal breath sounds. No stridor. No wheezing, rhonchi or rales.  Chest:     Chest wall: No tenderness.  Abdominal:     General: Bowel sounds are normal. There is no distension or abdominal bruit.     Palpations: Abdomen is soft. There is no hepatomegaly or splenomegaly.     Tenderness: There is no abdominal tenderness. There is no right CVA tenderness or left CVA tenderness.     Hernia: No hernia is present.  Musculoskeletal:        General: Normal range of motion.     Cervical back: Normal range of motion and neck supple.     Right lower leg: No edema.     Left lower leg: No edema.  Lymphadenopathy:     Cervical: No cervical adenopathy.  Skin:    General: Skin is warm and dry.     Capillary Refill: Capillary refill takes less than 2 seconds.     Coloration: Skin is not cyanotic, jaundiced or pale.     Findings: No rash.  Neurological:     General: No focal deficit present.     Mental Status: She is alert and oriented to person, place, and time.     Sensory: Sensation is intact.     Motor: Motor function is intact.     Coordination: Coordination is intact.     Gait: Gait is intact.     Deep Tendon Reflexes: Reflexes are normal and symmetric.  Psychiatric:        Attention and Perception: Attention and perception normal.        Mood and Affect: Mood and affect normal.        Speech: Speech normal.        Behavior: Behavior  normal. Behavior is cooperative.        Thought Content: Thought content normal.        Cognition and Memory: Cognition and memory normal.        Judgment: Judgment normal.      Results for orders placed or performed in visit on 05/24/22  Novel Coronavirus, NAA ([facility])   Specimen: Nasopharyngeal(NP) swabs in vial transport medium  Result Value Ref Range   SARS-CoV-2, NAA Not Detected Not Detected       Pertinent labs & imaging results that were available during my care of the patient were reviewed by me and considered in my medical decision making.  Assessment & Plan:  Carol Jensen was seen today for chills, cough, nasal congestion, loss smell and taste and headache.  Diagnoses and all orders for this visit:  Cough with exposure to COVID-19 virus Onset of symptoms 5 days ago. Will start molnupiravir as pt has CKD. Continue with tylenol and hydration at home. Cough medicine as needed at night. Sedation precautions discussed. Report new, worsening, or persistent symptoms. Aware of red flags which require emergent evaluation and treatment.  -     molnupiravir EUA (LAGEVRIO) 200 mg CAPS capsule; Take 4 capsules (800 mg total) by mouth 2 (two) times daily for 5 days. -     promethazine-dextromethorphan (PROMETHAZINE-DM) 6.25-15 MG/5ML syrup; Take 5 mLs by mouth 4 (four) times daily as needed for cough.     Continue all other maintenance medications.  Follow up plan: Return if symptoms worsen or fail to improve.   Continue healthy lifestyle choices, including diet (rich in fruits, vegetables, and lean proteins, and low in salt and simple carbohydrates) and exercise (at least 30 minutes of moderate physical activity daily).  Educational handout given for COVID-19  The above assessment and management plan was discussed with the patient. The patient verbalized understanding of and has agreed to the management plan. Patient is aware to call the clinic if they develop any new symptoms or if symptoms persist or worsen. Patient is aware when to return to the clinic for a follow-up visit. Patient educated on when it is appropriate to go to the emergency department.    Monia Pouch, FNP-C [facility] [phone]

## 2022-10-05 ENCOUNTER — Encounter: Payer: Self-pay | Admitting: Family Medicine

## 2022-10-05 ENCOUNTER — Ambulatory Visit (INDEPENDENT_AMBULATORY_CARE_PROVIDER_SITE_OTHER): Payer: Medicare Other | Admitting: Family Medicine

## 2022-10-05 VITALS — BP 116/73 | HR 98 | Ht 64.0 in | Wt 236.0 lb

## 2022-10-05 DIAGNOSIS — E1159 Type 2 diabetes mellitus with other circulatory complications: Secondary | ICD-10-CM

## 2022-10-05 DIAGNOSIS — Z79891 Long term (current) use of opiate analgesic: Secondary | ICD-10-CM | POA: Diagnosis not present

## 2022-10-05 DIAGNOSIS — I152 Hypertension secondary to endocrine disorders: Secondary | ICD-10-CM | POA: Diagnosis not present

## 2022-10-05 DIAGNOSIS — F411 Generalized anxiety disorder: Secondary | ICD-10-CM | POA: Diagnosis not present

## 2022-10-05 DIAGNOSIS — E103599 Type 1 diabetes mellitus with proliferative diabetic retinopathy without macular edema, unspecified eye: Secondary | ICD-10-CM

## 2022-10-05 DIAGNOSIS — N183 Chronic kidney disease, stage 3 unspecified: Secondary | ICD-10-CM | POA: Diagnosis not present

## 2022-10-05 DIAGNOSIS — M19011 Primary osteoarthritis, right shoulder: Secondary | ICD-10-CM

## 2022-10-05 DIAGNOSIS — E1059 Type 1 diabetes mellitus with other circulatory complications: Secondary | ICD-10-CM

## 2022-10-05 DIAGNOSIS — M19012 Primary osteoarthritis, left shoulder: Secondary | ICD-10-CM

## 2022-10-05 MED ORDER — OXAZEPAM 15 MG PO CAPS
15.0000 mg | ORAL_CAPSULE | Freq: Every day | ORAL | 2 refills | Status: DC | PRN
Start: 2022-10-05 — End: 2023-01-10

## 2022-10-05 MED ORDER — ACETAMINOPHEN-CODEINE 300-30 MG PO TABS
1.0000 | ORAL_TABLET | Freq: Four times a day (QID) | ORAL | 0 refills | Status: DC | PRN
Start: 2022-10-05 — End: 2023-02-11

## 2022-10-05 NOTE — Progress Notes (Signed)
BP 116/73   Pulse 98   Ht 5\' 4"  (1.626 m)   Wt 236 lb (107 kg)   LMP 03/24/2013   SpO2 98%   BMI 40.51 kg/m    Subjective:   [name], female    DOB: 01-Apr-1961, 62 y.o.   MRN: [id number]  HPI: Carol Jensen is a 62 y.o. female presenting on 10/05/2022 for Medical Management of Chronic Issues, Diabetes, and Chronic Kidney Disease   HPI Pain assessment: Cause of pain-osteoarthritis, mainly hips and knees and shoulders and then has anxiety and insomnia as well. Pain location-bilateral hips and knees Pain on scale of 1-10- 1 Frequency-infrequently What increases pain-weather changes or walking long distances. What makes pain Better-rest and Tylenol 3 as needed Effects on ADL -minimal Any change in general medical condition-none  Current opioids rx-Tylenol 3, 45, usually last for 3 to 4 months. # meds rx-also has oxazepam 15 mg nightly as needed, 30/month Effectiveness of current meds-works well Adverse reactions from pain meds-none Morphine equivalent- 18  Pill count performed-No Last drug screen -09/21/2021 ( high risk q24m, moderate risk q13m, low risk yearly ) Urine drug screen today- Yes Was the NCCSR reviewed-yes  If yes were their any concerning findings? -None  Pain contract signed on: Today  Type 1 diabetes mellitus Patient comes in today for recheck of his diabetes.  He sees endocrinologist for this along a lot of her medical issues.  Hypertension Patient is currently on lisinopril and hydrochlorothiazide, and their blood pressure today is 116/73. Patient denies any lightheadedness or dizziness. Patient denies headaches, blurred vision, chest pains, shortness of breath, or weakness. Denies any side effects from medication and is content with current medication.   Relevant past medical, surgical, family and social history reviewed and updated as indicated. Interim medical history since our last visit reviewed. Allergies and medications reviewed and  updated.  Review of Systems  Constitutional:  Negative for chills and fever.  HENT:  Negative for congestion, ear discharge and ear pain.   Eyes:  Negative for redness and visual disturbance.  Respiratory:  Negative for chest tightness and shortness of breath.   Cardiovascular:  Positive for leg swelling. Negative for chest pain.  Genitourinary:  Negative for difficulty urinating and dysuria.  Musculoskeletal:  Negative for back pain and gait problem.  Skin:  Negative for rash.  Neurological:  Negative for light-headedness and headaches.  Psychiatric/Behavioral:  Negative for agitation and behavioral problems.   All other systems reviewed and are negative.   Per HPI unless specifically indicated above   Allergies as of 10/05/2022       Reactions   Aspirin    States is not suppose to take due to eye hemorrhages.         Medication List        Accurate as of October 05, 2022 11:24 AM. If you have any questions, ask your nurse or doctor.          acetaminophen 500 MG tablet Commonly known as: TYLENOL Take 500 mg by mouth every 6 (six) hours as needed.   acetaminophen-codeine 300-30 MG tablet Commonly known as: TYLENOL #3 Take 1 tablet by mouth every 6 (six) hours as needed for moderate pain.   Biotin 40981 MCG Tabs Take by mouth daily.   clotrimazole-betamethasone cream Commonly known as: LOTRISONE Apply topically 2 (two) times daily.   Contour Next Test test strip Generic drug: glucose blood Test BS 3 times daily   DULoxetine  30 MG capsule Commonly known as: CYMBALTA Take 1 capsule (30 mg total) by mouth daily.   famotidine 20 MG tablet Commonly known as: PEPCID Take 1 tablet (20 mg total) by mouth 2 (two) times daily.   fluticasone 50 MCG/ACT nasal spray Commonly known as: FLONASE Place 1 spray into both nostrils 2 (two) times daily as needed for allergies or rhinitis.   hydrochlorothiazide 25 MG tablet Commonly known as: HYDRODIURIL Take 25 mg by  mouth daily.   insulin aspart 100 UNIT/ML injection Commonly known as: novoLOG Use approximately 80 units daily via Insulin Pump  DX E10.9 and Z96.41   lisinopril 10 MG tablet Commonly known as: ZESTRIL Take 1 tablet (10 mg total) by mouth daily.   omeprazole 20 MG capsule Commonly known as: PRILOSEC Take 1 capsule (20 mg total) by mouth daily.   oxazepam 15 MG capsule Commonly known as: SERAX Take 1 capsule (15 mg total) by mouth daily as needed for sleep.   promethazine-dextromethorphan 6.25-15 MG/5ML syrup Commonly known as: PROMETHAZINE-DM Take 5 mLs by mouth 4 (four) times daily as needed for cough.   TRUEplus Lancets 33G Misc Test blood sugars 3 times daily   VITAMIN C PO Take by mouth.   Vitamin D (Ergocalciferol) 1.25 MG (50000 UNIT) Caps capsule Commonly known as: DRISDOL TAKE ONE CAPSULE BY MOUTH EVERY 7 DAYS.   vitamin E 180 MG (400 UNITS) capsule Take by mouth.         Objective:   BP 116/73   Pulse 98   Ht  (1.626 m)   Wt 236 lb (107 kg)   LMP 03/24/2013   SpO2 98%   BMI 40.51 kg/m   Wt Readings from Last 3 Encounters:  10/05/22 236 lb (107 kg)  09/18/22 231 lb (104.8 kg)  07/06/22 235 lb (106.6 kg)    Physical Exam Vitals and nursing note reviewed.  Constitutional:      General: She is not in acute distress.    Appearance: She is well-developed. She is not diaphoretic.  Eyes:     Conjunctiva/sclera: Conjunctivae normal.  Cardiovascular:     Rate and Rhythm: Normal rate and regular rhythm.     Heart sounds: Normal heart sounds. No murmur heard. Pulmonary:     Effort: Pulmonary effort is normal. No respiratory distress.     Breath sounds: Normal breath sounds. No wheezing.  Musculoskeletal:        General: Swelling (Trace edema) present. No tenderness. Normal range of motion.  Skin:    General: Skin is warm and dry.     Findings: No rash.  Neurological:     Mental Status: She is alert and oriented to person, place, and time.      Coordination: Coordination normal.  Psychiatric:        Behavior: Behavior normal.       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  She did blood work through endocrinology so we do not need to do any today.  It looks like her A1c was 6.9 and she is doing a little better with    Follow up plan: Return in about 3 months (around 01/04/2023), or if symptoms worsen or fail to improve, for Pain medicine and anxiety.  Counseling provided for all of the vaccine components Orders Placed This Encounter  Procedures   Hemoglobin A1c   ToxASSURE Select 13 (MW), Urine    Arville Care, MD [facility] 10/05/2022, 11:24 AM

## 2022-10-11 LAB — TOXASSURE SELECT 13 (MW), URINE

## 2022-11-02 ENCOUNTER — Other Ambulatory Visit: Payer: Self-pay | Admitting: Family Medicine

## 2022-11-02 DIAGNOSIS — F411 Generalized anxiety disorder: Secondary | ICD-10-CM

## 2022-12-04 ENCOUNTER — Other Ambulatory Visit: Payer: Self-pay | Admitting: Family Medicine

## 2022-12-04 DIAGNOSIS — F411 Generalized anxiety disorder: Secondary | ICD-10-CM

## 2022-12-12 DIAGNOSIS — E611 Iron deficiency: Secondary | ICD-10-CM | POA: Diagnosis not present

## 2022-12-12 DIAGNOSIS — E103553 Type 1 diabetes mellitus with stable proliferative diabetic retinopathy, bilateral: Secondary | ICD-10-CM | POA: Diagnosis not present

## 2022-12-12 DIAGNOSIS — E103513 Type 1 diabetes mellitus with proliferative diabetic retinopathy with macular edema, bilateral: Secondary | ICD-10-CM | POA: Diagnosis not present

## 2022-12-12 DIAGNOSIS — E559 Vitamin D deficiency, unspecified: Secondary | ICD-10-CM | POA: Diagnosis not present

## 2022-12-12 DIAGNOSIS — Z6841 Body Mass Index (BMI) 40.0 and over, adult: Secondary | ICD-10-CM | POA: Diagnosis not present

## 2022-12-12 DIAGNOSIS — E10649 Type 1 diabetes mellitus with hypoglycemia without coma: Secondary | ICD-10-CM | POA: Diagnosis not present

## 2022-12-12 DIAGNOSIS — N1831 Chronic kidney disease, stage 3a: Secondary | ICD-10-CM | POA: Diagnosis not present

## 2022-12-12 DIAGNOSIS — Z9641 Presence of insulin pump (external) (internal): Secondary | ICD-10-CM | POA: Diagnosis not present

## 2023-01-09 ENCOUNTER — Telehealth: Payer: Self-pay

## 2023-01-09 DIAGNOSIS — F411 Generalized anxiety disorder: Secondary | ICD-10-CM

## 2023-01-09 NOTE — Telephone Encounter (Addendum)
Pt will have to r/s her OV today because she is unable to complete the appt virtually. Pt takes a controlled medication and will need a refill until the appt can be r/s. Will send to [facility] for review.  Oxazepam 15mg   Her contract is UTD

## 2023-01-10 ENCOUNTER — Ambulatory Visit: Payer: Medicare Other | Admitting: Family Medicine

## 2023-01-10 MED ORDER — OXAZEPAM 15 MG PO CAPS
15.0000 mg | ORAL_CAPSULE | Freq: Every day | ORAL | 0 refills | Status: DC | PRN
Start: 2023-01-10 — End: 2023-02-11

## 2023-01-10 NOTE — Addendum Note (Signed)
Addended by: Arville Care on: 01/10/2023 03:52 PM   Modules accepted: Orders

## 2023-01-10 NOTE — Telephone Encounter (Signed)
Sent 1 month oxazepam for the patient

## 2023-02-11 ENCOUNTER — Encounter: Payer: Self-pay | Admitting: Family Medicine

## 2023-02-11 ENCOUNTER — Other Ambulatory Visit: Payer: Self-pay

## 2023-02-11 ENCOUNTER — Ambulatory Visit (INDEPENDENT_AMBULATORY_CARE_PROVIDER_SITE_OTHER): Payer: Medicare Other | Admitting: Family Medicine

## 2023-02-11 VITALS — BP 111/70 | HR 93 | Ht 64.0 in | Wt 243.0 lb

## 2023-02-11 DIAGNOSIS — E103599 Type 1 diabetes mellitus with proliferative diabetic retinopathy without macular edema, unspecified eye: Secondary | ICD-10-CM | POA: Diagnosis not present

## 2023-02-11 DIAGNOSIS — M19011 Primary osteoarthritis, right shoulder: Secondary | ICD-10-CM

## 2023-02-11 DIAGNOSIS — E1022 Type 1 diabetes mellitus with diabetic chronic kidney disease: Secondary | ICD-10-CM

## 2023-02-11 DIAGNOSIS — E1159 Type 2 diabetes mellitus with other circulatory complications: Secondary | ICD-10-CM

## 2023-02-11 DIAGNOSIS — F411 Generalized anxiety disorder: Secondary | ICD-10-CM

## 2023-02-11 DIAGNOSIS — N183 Chronic kidney disease, stage 3 unspecified: Secondary | ICD-10-CM

## 2023-02-11 DIAGNOSIS — M19012 Primary osteoarthritis, left shoulder: Secondary | ICD-10-CM | POA: Diagnosis not present

## 2023-02-11 DIAGNOSIS — I152 Hypertension secondary to endocrine disorders: Secondary | ICD-10-CM | POA: Diagnosis not present

## 2023-02-11 DIAGNOSIS — E1059 Type 1 diabetes mellitus with other circulatory complications: Secondary | ICD-10-CM | POA: Diagnosis not present

## 2023-02-11 LAB — BAYER DCA HB A1C WAIVED: HB A1C (BAYER DCA - WAIVED): 6.9 % — ABNORMAL HIGH (ref 4.8–5.6)

## 2023-02-11 MED ORDER — ACETAMINOPHEN-CODEINE 300-30 MG PO TABS
1.0000 | ORAL_TABLET | Freq: Four times a day (QID) | ORAL | 0 refills | Status: DC | PRN
Start: 2023-02-11 — End: 2023-05-14

## 2023-02-11 MED ORDER — OXAZEPAM 15 MG PO CAPS
15.0000 mg | ORAL_CAPSULE | Freq: Every day | ORAL | 2 refills | Status: DC | PRN
Start: 2023-02-11 — End: 2023-05-14

## 2023-02-11 MED ORDER — OMEPRAZOLE 20 MG PO CPDR: 20.0000 mg | DELAYED_RELEASE_CAPSULE | Freq: Every day | ORAL | 3 refills | Status: DC

## 2023-02-11 MED ORDER — DULOXETINE HCL 30 MG PO CPEP
30.0000 mg | ORAL_CAPSULE | Freq: Every day | ORAL | 0 refills | Status: DC
Start: 2023-02-11 — End: 2023-05-14

## 2023-02-11 NOTE — Progress Notes (Signed)
BP 111/70   Pulse 93   Ht 5\' 4"  (1.626 m)   Wt 243 lb (110.2 kg)   LMP 03/24/2013   SpO2 97%   BMI 41.71 kg/m    Subjective:   [name], female    DOB: 10/01/1960, 62 y.o.   MRN: [id number]  HPI: Carol Jensen is a 62 y.o. female presenting on 02/11/2023 for Medical Management of Chronic Issues, Hypertension, Diabetes, Chronic Kidney Disease, and Anxiety   HPI Type 1 diabetes mellitus Patient comes in today for recheck of his diabetes. Patient has been currently taking NovoLog insulin pump, sees endocrinology as well. Patient is currently on an ACE inhibitor/ARB. Patient has not seen an ophthalmologist this year. Patient denies any new issues with their feet. The symptom started onset as an adult hypertension and CKD ARE RELATED TO DM   Hypertension and CKD 3 Patient is currently on lisinopril and hydrochlorothiazide, and their blood pressure today is 111/70. Patient denies any lightheadedness or dizziness. Patient denies headaches, blurred vision, chest pains, shortness of breath, or weakness. Denies any side effects from medication and is content with current medication.   Anxiety recheck Current rx-Cymbalta and oxazepam 15 mg nightly as needed, also takes Tylenol 3 infrequently and gets 15/month # meds rx-30/month of oxazepam and 15/month of Tylenol 3 Effectiveness of current meds-works well Adverse reactions form meds-none Pill count performed-No Last drug screen -10/18/2022 ( high risk q32m, moderate risk q49m, low risk yearly ) Urine drug screen today- No Was the NCCSR reviewed-yes  If yes were their any concerning findings? -None Controlled substance contract signed on: 10-18-22  Relevant past medical, surgical, family and social history reviewed and updated as indicated. Interim medical history since our last visit reviewed. Allergies and medications reviewed and updated.  Review of Systems  Constitutional:  Negative for chills and fever.  Eyes:  Negative  for visual disturbance.  Respiratory:  Negative for chest tightness and shortness of breath.   Cardiovascular:  Negative for chest pain and leg swelling.  Genitourinary:  Negative for difficulty urinating and dysuria.  Musculoskeletal:  Negative for back pain and gait problem.  Skin:  Negative for rash.  Neurological:  Negative for dizziness, light-headedness and headaches.  Psychiatric/Behavioral:  Negative for agitation and behavioral problems.   All other systems reviewed and are negative.   Per HPI unless specifically indicated above   Allergies as of 02/11/2023       Reactions   Aspirin    States is not suppose to take due to eye hemorrhages.         Medication List        Accurate as of February 11, 2023 11:40 AM. If you have any questions, ask your nurse or doctor.          acetaminophen 500 MG tablet Commonly known as: TYLENOL Take 500 mg by mouth every 6 (six) hours as needed.   acetaminophen-codeine 300-30 MG tablet Commonly known as: TYLENOL #3 Take 1 tablet by mouth every 6 (six) hours as needed for moderate pain.   Biotin 41660 MCG Tabs Take by mouth daily.   clotrimazole-betamethasone cream Commonly known as: LOTRISONE Apply topically 2 (two) times daily.   Contour Next Test test strip Generic drug: glucose blood Test BS 3 times daily   DULoxetine 30 MG capsule Commonly known as: CYMBALTA Take 1 capsule by mouth once daily   famotidine 20 MG tablet Commonly known as: PEPCID Take 1 tablet (20 mg total)  by mouth 2 (two) times daily.   fluticasone 50 MCG/ACT nasal spray Commonly known as: FLONASE Place 1 spray into both nostrils 2 (two) times daily as needed for allergies or rhinitis.   hydrochlorothiazide 25 MG tablet Commonly known as: HYDRODIURIL Take 25 mg by mouth daily.   insulin aspart 100 UNIT/ML injection Commonly known as: novoLOG Use approximately 80 units daily via Insulin Pump  DX E10.9 and Z96.41   lisinopril 10 MG  tablet Commonly known as: ZESTRIL Take 1 tablet (10 mg total) by mouth daily.   omeprazole 20 MG capsule Commonly known as: PRILOSEC Take 1 capsule (20 mg total) by mouth daily.   oxazepam 15 MG capsule Commonly known as: SERAX Take 1 capsule (15 mg total) by mouth daily as needed for sleep.   promethazine-dextromethorphan 6.25-15 MG/5ML syrup Commonly known as: PROMETHAZINE-DM Take 5 mLs by mouth 4 (four) times daily as needed for cough.   TRUEplus Lancets 33G Misc Test blood sugars 3 times daily   VITAMIN C PO Take by mouth.   Vitamin D (Ergocalciferol) 1.25 MG (50000 UNIT) Caps capsule Commonly known as: DRISDOL TAKE ONE CAPSULE BY MOUTH EVERY 7 DAYS.   vitamin E 180 MG (400 UNITS) capsule Take by mouth.         Objective:   BP 111/70   Pulse 93   Ht 5\' 4"  (1.626 m)   Wt 243 lb (110.2 kg)   LMP 03/24/2013   SpO2 97%   BMI 41.71 kg/m   Wt Readings from Last 3 Encounters:  02/11/23 243 lb (110.2 kg)  10/05/22 236 lb (107 kg)  09/18/22 231 lb (104.8 kg)    Physical Exam Vitals and nursing note reviewed.  Constitutional:      General: She is not in acute distress.    Appearance: She is well-developed. She is not diaphoretic.  Eyes:     Conjunctiva/sclera: Conjunctivae normal.     Pupils: Pupils are equal, round, and reactive to light.  Cardiovascular:     Rate and Rhythm: Normal rate and regular rhythm.     Heart sounds: Normal heart sounds. No murmur heard. Pulmonary:     Effort: Pulmonary effort is normal. No respiratory distress.     Breath sounds: Normal breath sounds. No wheezing.  Musculoskeletal:        General: No swelling. Normal range of motion.  Skin:    General: Skin is warm and dry.     Findings: No rash.  Neurological:     Mental Status: She is alert and oriented to person, place, and time.     Coordination: Coordination normal.  Psychiatric:        Behavior: Behavior normal.       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  Seems to be doing well.  Blood pressure and everything else looks good.  Then had a loss of the family recently but says the anxiety is doing okay with the medicines. Follow up plan: Return in about 3 months (around 05/14/2023), or if symptoms worsen or fail to improve, for Diabetes and anxiety recheck.  Counseling provided for all of the vaccine components No orders of the defined types were placed in this encounter.   Arville Care, MD [facility] 02/11/2023, 11:40 AM

## 2023-02-12 LAB — CBC WITH DIFFERENTIAL/PLATELET
Basophils Absolute: 0.1 10*3/uL (ref 0.0–0.2)
Basos: 1 %
EOS (ABSOLUTE): 0.4 10*3/uL (ref 0.0–0.4)
Eos: 5 %
Hematocrit: 33.6 % — ABNORMAL LOW (ref 34.0–46.6)
Hemoglobin: 11.1 g/dL (ref 11.1–15.9)
Immature Grans (Abs): 0 10*3/uL (ref 0.0–0.1)
Immature Granulocytes: 0 %
Lymphocytes Absolute: 1.7 10*3/uL (ref 0.7–3.1)
Lymphs: 21 %
MCH: 29.2 pg (ref 26.6–33.0)
MCHC: 33 g/dL (ref 31.5–35.7)
MCV: 88 fL (ref 79–97)
Monocytes Absolute: 0.5 10*3/uL (ref 0.1–0.9)
Monocytes: 6 %
Neutrophils Absolute: 5.4 10*3/uL (ref 1.4–7.0)
Neutrophils: 67 %
Platelets: 276 10*3/uL (ref 150–450)
RBC: 3.8 x10E6/uL (ref 3.77–5.28)
RDW: 12.7 % (ref 11.7–15.4)
WBC: 8 10*3/uL (ref 3.4–10.8)

## 2023-02-12 LAB — CMP14+EGFR
ALT: 14 IU/L (ref 0–32)
AST: 16 IU/L (ref 0–40)
Albumin: 4.1 g/dL (ref 3.8–4.9)
Alkaline Phosphatase: 75 IU/L (ref 44–121)
BUN/Creatinine Ratio: 24 (ref 12–28)
BUN: 31 mg/dL — ABNORMAL HIGH (ref 8–27)
Bilirubin Total: 0.4 mg/dL (ref 0.0–1.2)
CO2: 21 mmol/L (ref 20–29)
Calcium: 9.4 mg/dL (ref 8.7–10.3)
Chloride: 104 mmol/L (ref 96–106)
Creatinine, Ser: 1.27 mg/dL — ABNORMAL HIGH (ref 0.57–1.00)
Globulin, Total: 2.4 g/dL (ref 1.5–4.5)
Glucose: 209 mg/dL — ABNORMAL HIGH (ref 70–99)
Potassium: 4.8 mmol/L (ref 3.5–5.2)
Sodium: 140 mmol/L (ref 134–144)
Total Protein: 6.5 g/dL (ref 6.0–8.5)
eGFR: 48 mL/min/{1.73_m2} — ABNORMAL LOW (ref >59)

## 2023-02-12 LAB — LIPID PANEL
Chol/HDL Ratio: 1.6 ratio (ref 0.0–4.4)
Cholesterol, Total: 130 mg/dL (ref 100–199)
HDL: 79 mg/dL (ref >39)
LDL Chol Calc (NIH): 40 mg/dL (ref 0–99)
Triglycerides: 46 mg/dL (ref 0–149)
VLDL Cholesterol Cal: 11 mg/dL (ref 5–40)

## 2023-02-21 ENCOUNTER — Telehealth: Payer: Self-pay | Admitting: Family Medicine

## 2023-03-14 DIAGNOSIS — Z6841 Body Mass Index (BMI) 40.0 and over, adult: Secondary | ICD-10-CM | POA: Diagnosis not present

## 2023-03-14 DIAGNOSIS — N1831 Chronic kidney disease, stage 3a: Secondary | ICD-10-CM | POA: Diagnosis not present

## 2023-03-14 DIAGNOSIS — E10649 Type 1 diabetes mellitus with hypoglycemia without coma: Secondary | ICD-10-CM | POA: Diagnosis not present

## 2023-03-14 DIAGNOSIS — E103513 Type 1 diabetes mellitus with proliferative diabetic retinopathy with macular edema, bilateral: Secondary | ICD-10-CM | POA: Diagnosis not present

## 2023-03-14 DIAGNOSIS — Z9641 Presence of insulin pump (external) (internal): Secondary | ICD-10-CM | POA: Diagnosis not present

## 2023-03-14 DIAGNOSIS — Z23 Encounter for immunization: Secondary | ICD-10-CM | POA: Diagnosis not present

## 2023-03-14 DIAGNOSIS — Z1330 Encounter for screening examination for mental health and behavioral disorders, unspecified: Secondary | ICD-10-CM | POA: Diagnosis not present

## 2023-03-29 ENCOUNTER — Encounter: Payer: Self-pay | Admitting: Family Medicine

## 2023-03-29 ENCOUNTER — Ambulatory Visit: Payer: Medicare Other

## 2023-05-13 ENCOUNTER — Other Ambulatory Visit: Payer: Self-pay | Admitting: Family Medicine

## 2023-05-13 DIAGNOSIS — F411 Generalized anxiety disorder: Secondary | ICD-10-CM

## 2023-05-15 ENCOUNTER — Encounter: Payer: Self-pay | Admitting: Family Medicine

## 2023-05-15 ENCOUNTER — Ambulatory Visit: Payer: Medicare Other | Admitting: Family Medicine

## 2023-05-15 VITALS — BP 111/70 | HR 105 | Temp 98.0°F | Ht 64.0 in | Wt 231.2 lb

## 2023-05-15 DIAGNOSIS — E1022 Type 1 diabetes mellitus with diabetic chronic kidney disease: Secondary | ICD-10-CM | POA: Diagnosis not present

## 2023-05-15 DIAGNOSIS — K219 Gastro-esophageal reflux disease without esophagitis: Secondary | ICD-10-CM

## 2023-05-15 DIAGNOSIS — M19012 Primary osteoarthritis, left shoulder: Secondary | ICD-10-CM | POA: Diagnosis not present

## 2023-05-15 DIAGNOSIS — E103599 Type 1 diabetes mellitus with proliferative diabetic retinopathy without macular edema, unspecified eye: Secondary | ICD-10-CM | POA: Diagnosis not present

## 2023-05-15 DIAGNOSIS — F411 Generalized anxiety disorder: Secondary | ICD-10-CM | POA: Diagnosis not present

## 2023-05-15 DIAGNOSIS — E1059 Type 1 diabetes mellitus with other circulatory complications: Secondary | ICD-10-CM | POA: Diagnosis not present

## 2023-05-15 DIAGNOSIS — R0981 Nasal congestion: Secondary | ICD-10-CM

## 2023-05-15 DIAGNOSIS — E109 Type 1 diabetes mellitus without complications: Secondary | ICD-10-CM | POA: Diagnosis not present

## 2023-05-15 DIAGNOSIS — E559 Vitamin D deficiency, unspecified: Secondary | ICD-10-CM

## 2023-05-15 DIAGNOSIS — M19011 Primary osteoarthritis, right shoulder: Secondary | ICD-10-CM | POA: Diagnosis not present

## 2023-05-15 DIAGNOSIS — N183 Chronic kidney disease, stage 3 unspecified: Secondary | ICD-10-CM | POA: Diagnosis not present

## 2023-05-15 DIAGNOSIS — I152 Hypertension secondary to endocrine disorders: Secondary | ICD-10-CM | POA: Diagnosis not present

## 2023-05-15 DIAGNOSIS — E1159 Type 2 diabetes mellitus with other circulatory complications: Secondary | ICD-10-CM

## 2023-05-15 LAB — BAYER DCA HB A1C WAIVED: HB A1C (BAYER DCA - WAIVED): 6.7 % — ABNORMAL HIGH (ref 4.8–5.6)

## 2023-05-15 MED ORDER — ACETAMINOPHEN-CODEINE 300-30 MG PO TABS: 1.0000 | ORAL_TABLET | Freq: Four times a day (QID) | ORAL | 0 refills | Status: DC | PRN

## 2023-05-15 MED ORDER — DULOXETINE HCL 30 MG PO CPEP: 30.0000 mg | ORAL_CAPSULE | Freq: Every day | ORAL | 3 refills | Status: DC

## 2023-05-15 MED ORDER — OXAZEPAM 15 MG PO CAPS: 15.0000 mg | ORAL_CAPSULE | Freq: Every day | ORAL | 2 refills | Status: DC | PRN

## 2023-05-15 MED ORDER — FAMOTIDINE 20 MG PO TABS: 20.0000 mg | ORAL_TABLET | Freq: Two times a day (BID) | ORAL | 3 refills | Status: DC

## 2023-05-15 MED ORDER — VITAMIN D (ERGOCALCIFEROL) 1.25 MG (50000 UNIT) PO CAPS: 50000.0000 [IU] | ORAL_CAPSULE | ORAL | 0 refills | Status: AC

## 2023-05-15 MED ORDER — FLUTICASONE PROPIONATE 50 MCG/ACT NA SUSP: 1.0000 | Freq: Two times a day (BID) | NASAL | 6 refills | Status: DC | PRN

## 2023-05-15 NOTE — Progress Notes (Signed)
BP 111/70   Pulse (!) 105   Temp 98 F (36.7 C) (Temporal)   Ht 5\' 4"  (1.626 m)   Wt 231 lb 3.2 oz (104.9 kg)   LMP 03/24/2013   SpO2 95%   BMI 39.69 kg/m    Subjective:   [name], female    DOB: September 11, 1960, 62 y.o.   MRN: [id number]  HPI: Carol Jensen is a 62 y.o. female presenting on 05/15/2023 for Medical Management of Chronic Issues (3 month)   HPI Type 1 diabetes mellitus Patient comes in today for recheck of his diabetes. Patient has been currently taking NovoLog, has insulin pump and sees endocrinology.. Patient is currently on an ACE inhibitor/ARB. Patient has not seen an ophthalmologist this year. Patient denies any new issues with their feet. The symptom started onset as an adult hypertension ARE RELATED TO DM   Hypertension Patient is currently on lisinopril, and their blood pressure today is 117/70. Patient denies any lightheadedness or dizziness. Patient denies headaches, blurred vision, chest pains, shortness of breath, or weakness. Denies any side effects from medication and is content with current medication.   GERD Patient is currently on omeprazole.  She denies any major symptoms or abdominal pain or belching or burping. She denies any blood in her stool or lightheadedness or dizziness.   Anxiety and chronic pain and arthritis recheck Current rx-Tylenol 3 every 6 hours as needed, gets 45 tablets for 3 or 4 months.  Oxazepam 15 mg nightly as needed # meds rx-30/month Effectiveness of current meds-works well Adverse reactions form meds-none Pill count performed-No Last drug screen -5-24 ( high risk q52m, moderate risk q12m, low risk yearly ) Urine drug screen today- No Was the NCCSR reviewed-yes  If yes were their any concerning findings? -None Controlled substance contract signed on: 10/18/2022  Relevant past medical, surgical, family and social history reviewed and updated as indicated. Interim medical history since our last visit  reviewed. Allergies and medications reviewed and updated.  Review of Systems  Constitutional:  Negative for chills and fever.  HENT:  Negative for congestion, ear discharge and ear pain.   Eyes:  Negative for redness and visual disturbance.  Respiratory:  Negative for chest tightness and shortness of breath.   Cardiovascular:  Negative for chest pain and leg swelling.  Genitourinary:  Negative for difficulty urinating and dysuria.  Musculoskeletal:  Positive for arthralgias. Negative for back pain and gait problem.  Skin:  Negative for rash.  Neurological:  Negative for dizziness, light-headedness and headaches.  Psychiatric/Behavioral:  Positive for sleep disturbance. Negative for agitation and behavioral problems. The patient is nervous/anxious.   All other systems reviewed and are negative.   Per HPI unless specifically indicated above   Allergies as of 05/15/2023       Reactions   Aspirin    States is not suppose to take due to eye hemorrhages.         Medication List        Accurate as of May 15, 2023  2:42 PM. If you have any questions, ask your nurse or doctor.          acetaminophen 500 MG tablet Commonly known as: TYLENOL Take 500 mg by mouth every 6 (six) hours as needed.   acetaminophen-codeine 300-30 MG tablet Commonly known as: TYLENOL #3 Take 1 tablet by mouth every 6 (six) hours as needed for moderate pain (pain score 4-6).   Biotin 52841 MCG Tabs Take by mouth  daily.   clotrimazole-betamethasone cream Commonly known as: LOTRISONE Apply topically 2 (two) times daily.   Contour Next Test test strip Generic drug: glucose blood Test BS 3 times daily   DULoxetine 30 MG capsule Commonly known as: CYMBALTA Take 1 capsule (30 mg total) by mouth daily.   famotidine 20 MG tablet Commonly known as: PEPCID Take 1 tablet (20 mg total) by mouth 2 (two) times daily.   fluticasone 50 MCG/ACT nasal spray Commonly known as: FLONASE Place 1 spray  into both nostrils 2 (two) times daily as needed for allergies or rhinitis.   hydrochlorothiazide 25 MG tablet Commonly known as: HYDRODIURIL Take 25 mg by mouth daily.   insulin aspart 100 UNIT/ML injection Commonly known as: novoLOG Use approximately 80 units daily via Insulin Pump  DX E10.9 and Z96.41   lisinopril 10 MG tablet Commonly known as: ZESTRIL Take 1 tablet (10 mg total) by mouth daily.   omeprazole 20 MG capsule Commonly known as: PRILOSEC Take 1 capsule (20 mg total) by mouth daily.   oxazepam 15 MG capsule Commonly known as: SERAX Take 1 capsule (15 mg total) by mouth daily as needed for sleep.   promethazine-dextromethorphan 6.25-15 MG/5ML syrup Commonly known as: PROMETHAZINE-DM Take 5 mLs by mouth 4 (four) times daily as needed for cough.   TRUEplus Lancets 33G Misc Test blood sugars 3 times daily   VITAMIN C PO Take by mouth.   Vitamin D (Ergocalciferol) 1.25 MG (50000 UNIT) Caps capsule Commonly known as: DRISDOL Take 1 capsule (50,000 Units total) by mouth every 7 (seven) days. What changed: See the new instructions. Changed by: Elige Radon Antonette Hendricks   vitamin E 180 MG (400 UNITS) capsule Take by mouth.         Objective:   BP 111/70   Pulse (!) 105   Temp 98 F (36.7 C) (Temporal)   Ht 5\' 4"  (1.626 m)   Wt 231 lb 3.2 oz (104.9 kg)   LMP 03/24/2013   SpO2 95%   BMI 39.69 kg/m   Wt Readings from Last 3 Encounters:  05/15/23 231 lb 3.2 oz (104.9 kg)  02/11/23 243 lb (110.2 kg)  10/05/22 236 lb (107 kg)    Physical Exam Vitals and nursing note reviewed.  Constitutional:      General: She is not in acute distress.    Appearance: She is well-developed. She is not diaphoretic.  Eyes:     Conjunctiva/sclera: Conjunctivae normal.  Cardiovascular:     Rate and Rhythm: Normal rate and regular rhythm.     Heart sounds: Normal heart sounds. No murmur heard. Pulmonary:     Effort: Pulmonary effort is normal. No respiratory distress.      Breath sounds: Normal breath sounds. No wheezing.  Musculoskeletal:        General: No tenderness. Normal range of motion.  Skin:    General: Skin is warm and dry.     Findings: No rash.  Neurological:     Mental Status: She is alert and oriented to person, place, and time.     Coordination: Coordination normal.  Psychiatric:        Behavior: Behavior normal.       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)     Other   GAD (generalized anxiety disorder)   Relevant Medications   acetaminophen-codeine (TYLENOL #3) 300-30 MG tablet   DULoxetine (CYMBALTA) 30 MG capsule   oxazepam (SERAX) 15 MG capsule   Vitamin D deficiency   Relevant Medications   Vitamin D, Ergocalciferol, (DRISDOL) 1.25 MG (50000 UNIT) CAPS capsule   Other Relevant Orders   VITAMIN D 25 Hydroxy (Vit-D Deficiency, Fractures)   Other Visit Diagnoses     Bilateral shoulder region arthritis       Relevant Medications   acetaminophen-codeine (TYLENOL #3) 300-30 MG tablet   Sinus congestion       Relevant Medications   fluticasone (FLONASE) 50 MCG/ACT nasal spray       A1c looks good at 6.7 and blood pressure looks good.  No changes, refilled medications for her. Follow up plan: Return in about 3 months (around 08/15/2023), or if symptoms worsen or fail to improve, for Diabetes and anxiety..  Counseling provided for all of the vaccine components Orders Placed This Encounter  Procedures   Bayer DCA Hb A1c Waived   VITAMIN  D 25 Hydroxy (Vit-D Deficiency, Fractures)   Microalbumin / creatinine urine ratio    Arville Care, MD [facility] 05/15/2023, 2:42 PM

## 2023-05-16 LAB — VITAMIN D 25 HYDROXY (VIT D DEFICIENCY, FRACTURES): Vit D, 25-Hydroxy: 44.2 ng/mL (ref 30.0–100.0)

## 2023-05-17 LAB — MICROALBUMIN / CREATININE URINE RATIO
Creatinine, Urine: 167.3 mg/dL
Microalb/Creat Ratio: 5 mg/g{creat} (ref 0–29)
Microalbumin, Urine: 9 ug/mL

## 2023-06-13 DIAGNOSIS — E10649 Type 1 diabetes mellitus with hypoglycemia without coma: Secondary | ICD-10-CM | POA: Diagnosis not present

## 2023-06-13 DIAGNOSIS — Z9641 Presence of insulin pump (external) (internal): Secondary | ICD-10-CM | POA: Diagnosis not present

## 2023-06-13 DIAGNOSIS — E103553 Type 1 diabetes mellitus with stable proliferative diabetic retinopathy, bilateral: Secondary | ICD-10-CM | POA: Diagnosis not present

## 2023-06-13 DIAGNOSIS — Z6841 Body Mass Index (BMI) 40.0 and over, adult: Secondary | ICD-10-CM | POA: Diagnosis not present

## 2023-06-13 DIAGNOSIS — E103513 Type 1 diabetes mellitus with proliferative diabetic retinopathy with macular edema, bilateral: Secondary | ICD-10-CM | POA: Diagnosis not present

## 2023-06-13 DIAGNOSIS — N1831 Chronic kidney disease, stage 3a: Secondary | ICD-10-CM | POA: Diagnosis not present

## 2023-07-05 ENCOUNTER — Other Ambulatory Visit: Payer: Self-pay | Admitting: Adult Health

## 2023-07-05 DIAGNOSIS — Z1231 Encounter for screening mammogram for malignant neoplasm of breast: Secondary | ICD-10-CM

## 2023-07-10 ENCOUNTER — Encounter: Payer: Medicare Other | Admitting: Family Medicine

## 2023-07-10 ENCOUNTER — Inpatient Hospital Stay: Admission: RE | Admit: 2023-07-10 | Payer: Medicare Other | Source: Ambulatory Visit

## 2023-07-11 ENCOUNTER — Telehealth: Payer: Self-pay

## 2023-07-11 ENCOUNTER — Other Ambulatory Visit (HOSPITAL_COMMUNITY): Payer: Self-pay

## 2023-07-11 NOTE — Telephone Encounter (Signed)
*  Primary  Pharmacy Patient Advocate Encounter   Received notification from Fax that prior authorization for Oxazepam 15mg  is required/requested.   Insurance verification completed.   The patient is insured through [employer] .   Per test claim:  Alprazolam/Buspirone/Diazepam/Lorazepam/ Temazepam is preferred by the insurance.  If suggested medication is appropriate, Please send in a new RX and discontinue this one. If not, please advise as to why it's not appropriate so that we may request a Prior Authorization. Please note, some preferred medications may still require a PA.  If the suggested medications have not been trialed and there are no contraindications to their use, the PA will not be submitted, as it will not be approved.   Full letter received by plan has been attached in patients media.

## 2023-07-11 NOTE — Telephone Encounter (Signed)
Patient has been on this medicine for quite many years, please run the prior authorization for this medicine

## 2023-07-11 NOTE — Telephone Encounter (Signed)
Pharmacy Patient Advocate Encounter   Received notification from Pt Calls Messages that prior authorization for Oxazepam 15MG  capsules is required/requested.   Insurance verification completed.   The patient is insured through [employer] .   Per test claim: PA required and submitted KEY/EOC/Request #: [id number] APPROVED from 06/19/23 to 06/17/24. Unable to obtain price due to refill too soon rejection, last fill date 06/20/23 next available fill date01/25/25

## 2023-07-11 NOTE — Telephone Encounter (Signed)
PA request has been Approved. New Encounter created for follow up. For additional info see Pharmacy Prior Auth telephone encounter from 07/11/23.

## 2023-07-15 ENCOUNTER — Other Ambulatory Visit (HOSPITAL_COMMUNITY): Payer: Self-pay

## 2023-07-29 ENCOUNTER — Ambulatory Visit
Admission: RE | Admit: 2023-07-29 | Discharge: 2023-07-29 | Disposition: A | Discharge status: Home / Self Care | Payer: Medicare Other | Source: Ambulatory Visit | Attending: Adult Health | Admitting: Adult Health

## 2023-07-29 DIAGNOSIS — Z1231 Encounter for screening mammogram for malignant neoplasm of breast: Secondary | ICD-10-CM

## 2023-08-01 ENCOUNTER — Telehealth: Payer: Self-pay | Admitting: *Deleted

## 2023-08-01 NOTE — Telephone Encounter (Signed)
Pt aware mammogram was negative. Need to have repeated in 1 year. Pt voiced understanding. JSY

## 2023-08-01 NOTE — Telephone Encounter (Signed)
-----   Message from Frederica sent at 07/31/2023  4:55 PM EST ----- Let her know mammogram was negative. Repeat in 1 year THX

## 2023-08-05 ENCOUNTER — Other Ambulatory Visit (HOSPITAL_COMMUNITY)
Admission: RE | Admit: 2023-08-05 | Discharge: 2023-08-05 | Disposition: A | Discharge status: Home / Self Care | Payer: Medicare Other | Source: Ambulatory Visit | Attending: Family Medicine | Admitting: Family Medicine

## 2023-08-05 ENCOUNTER — Encounter: Payer: Self-pay | Admitting: Family Medicine

## 2023-08-05 ENCOUNTER — Ambulatory Visit: Payer: Medicare Other | Admitting: Family Medicine

## 2023-08-05 VITALS — BP 124/61 | HR 100 | Ht 64.0 in | Wt 240.0 lb

## 2023-08-05 DIAGNOSIS — Z01419 Encounter for gynecological examination (general) (routine) without abnormal findings: Secondary | ICD-10-CM | POA: Insufficient documentation

## 2023-08-05 MED ORDER — CLOTRIMAZOLE-BETAMETHASONE 1-0.05 % EX CREA: TOPICAL_CREAM | Freq: Two times a day (BID) | CUTANEOUS | 2 refills | Status: AC

## 2023-08-05 NOTE — Progress Notes (Signed)
BP 124/61   Pulse 100   Ht 5\' 4"  (1.626 m)   Wt 240 lb (108.9 kg)   LMP 03/24/2013   SpO2 100%   BMI 41.20 kg/m    Subjective:   [name], female    DOB: 1960-07-09, 63 y.o.   MRN: [id number]  HPI: Carol Jensen is a 63 y.o. female presenting on 08/05/2023 for Medical Management of Chronic Issues   HPI Exam and physical Patient denies any chest pain, shortness of breath, headaches or vision issues, abdominal complaints, diarrhea, nausea, vomiting, or joint issues.  Patient denies any major issues.  Did just have a mammogram a week ago.  She has not been sexually active in more than 10 years.  She had no  Relevant past medical, surgical, family and social history reviewed and updated as indicated. Interim medical history since our last visit reviewed. Allergies and medications reviewed and updated.  Review of Systems  Constitutional:  Negative for chills and fever.  HENT:  Negative for ear pain and tinnitus.   Eyes:  Negative for pain.  Respiratory:  Negative for cough, shortness of breath and wheezing.   Cardiovascular:  Negative for chest pain, palpitations and leg swelling.  Gastrointestinal:  Negative for abdominal pain, blood in stool, constipation and diarrhea.  Genitourinary:  Negative for dysuria and hematuria.  Skin:  Negative for rash.  Neurological:  Negative for dizziness, weakness and headaches.  Psychiatric/Behavioral:  Negative for suicidal ideas.     Per HPI unless specifically indicated above   Allergies as of 08/05/2023       Reactions   Aspirin    States is not suppose to take due to eye hemorrhages.         Medication List        Accurate as of August 05, 2023  3:30 PM. If you have any questions, ask your nurse or doctor.          acetaminophen 500 MG tablet Commonly known as: TYLENOL Take 500 mg by mouth every 6 (six) hours as needed.   acetaminophen-codeine 300-30 MG tablet Commonly known as: TYLENOL #3 Take 1  tablet by mouth every 6 (six) hours as needed for moderate pain (pain score 4-6).   Biotin 40981 MCG Tabs Take by mouth daily.   clotrimazole-betamethasone cream Commonly known as: LOTRISONE Apply topically 2 (two) times daily.   Contour Next Test test strip Generic drug: glucose blood Test BS 3 times daily   DULoxetine 30 MG capsule Commonly known as: CYMBALTA Take 1 capsule (30 mg total) by mouth daily.   famotidine 20 MG tablet Commonly known as: PEPCID Take 1 tablet (20 mg total) by mouth 2 (two) times daily.   fluticasone 50 MCG/ACT nasal spray Commonly known as: FLONASE Place 1 spray into both nostrils 2 (two) times daily as needed for allergies or rhinitis.   hydrochlorothiazide 25 MG tablet Commonly known as: HYDRODIURIL Take 25 mg by mouth daily.   insulin aspart 100 UNIT/ML injection Commonly known as: novoLOG Use approximately 80 units daily via Insulin Pump  DX E10.9 and Z96.41   lisinopril 10 MG tablet Commonly known as: ZESTRIL Take 1 tablet (10 mg total) by mouth daily.   omeprazole 20 MG capsule Commonly known as: PRILOSEC Take 1 capsule (20 mg total) by mouth daily.   oxazepam 15 MG capsule Commonly known as: SERAX Take 1 capsule (15 mg total) by mouth daily as needed for sleep.   promethazine-dextromethorphan 6.25-15  MG/5ML syrup Commonly known as: PROMETHAZINE-DM Take 5 mLs by mouth 4 (four) times daily as needed for cough.   TRUEplus Lancets 33G Misc Test blood sugars 3 times daily   VITAMIN C PO Take by mouth.   Vitamin D (Ergocalciferol) 1.25 MG (50000 UNIT) Caps capsule Commonly known as: DRISDOL Take 1 capsule (50,000 Units total) by mouth every 7 (seven) days.   vitamin E 180 MG (400 UNITS) capsule Take by mouth.         Objective:   BP 124/61   Pulse 100   Ht 5\' 4"  (1.626 m)   Wt 240 lb (108.9 kg)   LMP 03/24/2013   SpO2 100%   BMI 41.20 kg/m   Wt Readings from Last 3 Encounters:  08/05/23 240 lb (108.9 kg)   05/15/23 231 lb 3.2 oz (104.9 kg)  02/11/23 243 lb (110.2 kg)    Physical Exam Vitals and nursing note reviewed. Exam conducted with a chaperone present.  Constitutional:      General: She is not in acute distress.    Appearance: She is well-developed. She is not diaphoretic.  Eyes:     Conjunctiva/sclera: Conjunctivae normal.  Cardiovascular:     Rate and Rhythm: Normal rate and regular rhythm.     Heart sounds: Normal heart sounds. No murmur heard. Pulmonary:     Effort: Pulmonary effort is normal. No respiratory distress.     Breath sounds: Normal breath sounds. No wheezing.  Genitourinary:    Exam position: Lithotomy position.     Labia:        Right: No rash or tenderness.        Left: No rash or tenderness.      Vagina: Tenderness (Vaginal tenderness and atrophy and dryness) present. No bleeding or prolapsed vaginal walls.     Cervix: Normal.     Uterus: Normal. No uterine prolapse.      Adnexa: Right adnexa normal and left adnexa normal.  Musculoskeletal:        General: No tenderness. Normal range of motion.  Skin:    General: Skin is warm and dry.     Findings: No rash.  Neurological:     Mental Status: She is alert and oriented to person, place, and time.     Coordination: Coordination normal.  Psychiatric:        Behavior: Behavior normal.       Assessment & Plan:   Problem List Items Addressed This Visit   None Visit Diagnoses       Well woman exam with routine gynecological exam    -  Primary   Relevant Orders   Cytology - PAP([place])     Patient will be back in just over a week for her normal visit.  Discussed possibly doing estrogen cream for vaginal atrophy and she is going to think on it some and maybe try K-Y jelly every day before that  Follow up plan: Return if symptoms worsen or fail to improve.  Counseling provided for all of the vaccine components No orders of the defined types were placed in this encounter.   Arville Care, MD [facility] 08/05/2023, 3:30 PM

## 2023-08-12 ENCOUNTER — Encounter: Payer: Self-pay | Admitting: Family Medicine

## 2023-08-12 LAB — CYTOLOGY - PAP
Diagnosis: NEGATIVE
Diagnosis: REACTIVE

## 2023-08-15 ENCOUNTER — Ambulatory Visit: Payer: Medicare Other | Admitting: Family Medicine

## 2023-08-15 ENCOUNTER — Encounter: Payer: Self-pay | Admitting: Family Medicine

## 2023-08-15 VITALS — BP 135/69 | HR 93 | Ht 64.0 in | Wt 242.0 lb

## 2023-08-15 DIAGNOSIS — E1022 Type 1 diabetes mellitus with diabetic chronic kidney disease: Secondary | ICD-10-CM | POA: Diagnosis not present

## 2023-08-15 DIAGNOSIS — I152 Hypertension secondary to endocrine disorders: Secondary | ICD-10-CM | POA: Diagnosis not present

## 2023-08-15 DIAGNOSIS — E103599 Type 1 diabetes mellitus with proliferative diabetic retinopathy without macular edema, unspecified eye: Secondary | ICD-10-CM | POA: Diagnosis not present

## 2023-08-15 DIAGNOSIS — M19011 Primary osteoarthritis, right shoulder: Secondary | ICD-10-CM | POA: Diagnosis not present

## 2023-08-15 DIAGNOSIS — F411 Generalized anxiety disorder: Secondary | ICD-10-CM | POA: Diagnosis not present

## 2023-08-15 DIAGNOSIS — E1159 Type 2 diabetes mellitus with other circulatory complications: Secondary | ICD-10-CM | POA: Diagnosis not present

## 2023-08-15 DIAGNOSIS — M19012 Primary osteoarthritis, left shoulder: Secondary | ICD-10-CM

## 2023-08-15 DIAGNOSIS — N952 Postmenopausal atrophic vaginitis: Secondary | ICD-10-CM

## 2023-08-15 DIAGNOSIS — N183 Chronic kidney disease, stage 3 unspecified: Secondary | ICD-10-CM | POA: Diagnosis not present

## 2023-08-15 LAB — BAYER DCA HB A1C WAIVED: HB A1C (BAYER DCA - WAIVED): 6.4 % — ABNORMAL HIGH (ref 4.8–5.6)

## 2023-08-15 MED ORDER — OXAZEPAM 15 MG PO CAPS: 15.0000 mg | ORAL_CAPSULE | Freq: Every day | ORAL | 2 refills | Status: DC | PRN

## 2023-08-15 MED ORDER — ESTRADIOL 0.1 MG/GM VA CREA: TOPICAL_CREAM | VAGINAL | 1 refills | Status: AC

## 2023-08-15 MED ORDER — FAMOTIDINE 20 MG PO TABS: 20.0000 mg | ORAL_TABLET | Freq: Two times a day (BID) | ORAL | 3 refills | Status: AC

## 2023-08-15 MED ORDER — LISINOPRIL 10 MG PO TABS: 10.0000 mg | ORAL_TABLET | Freq: Every day | ORAL | 3 refills | Status: AC

## 2023-08-15 MED ORDER — ACETAMINOPHEN-CODEINE 300-30 MG PO TABS: 1.0000 | ORAL_TABLET | Freq: Four times a day (QID) | ORAL | 0 refills | Status: DC | PRN

## 2023-08-15 NOTE — Addendum Note (Signed)
 Addended by: Arville Care on: 08/15/2023 08:01 AM   Modules accepted: Level of Service

## 2023-08-15 NOTE — Progress Notes (Signed)
 BP 135/69   Pulse 93   Ht 5\' 4"  (1.626 m)   Wt 242 lb (109.8 kg)   LMP 03/24/2013   SpO2 98%   BMI 41.54 kg/m    Subjective:   [name], female    DOB: 1960-12-08, 63 y.o.   MRN: [id number]  HPI: Carol Jensen is a 63 y.o. female presenting on 08/15/2023 for Medical Management of Chronic Issues and Diabetes   HPI Type 1 diabetes mellitus Patient comes in today for recheck of his diabetes. Patient has been currently taking NovoLog. Patient is currently on an ACE inhibitor/ARB. Patient has not seen an ophthalmologist this year. Patient denies any new issues with their feet. The symptom started onset as an adult retinopathy and CKD and neuropathy ARE RELATED TO DM   Hypertension Patient is currently on lisinopril and hydrochlorothiazide, and their blood pressure today is 135/69. Patient denies any lightheadedness or dizziness. Patient denies headaches, blurred vision, chest pains, shortness of breath, or weakness. Denies any side effects from medication and is content with current medication.   Pain assessment: Cause of pain-degenerative disc disease and arthritis of her back.  Also has medicine for anxiety as well Pain location-lower back and sometimes her knees Pain on scale of 1-10- 3 Frequency-some days, most days she does not take it. What increases pain-prolonged standing on her feet or walking What makes pain Better-rest and when it is really bad she takes the Tylenol 3 Effects on ADL -minimal Any change in general medical condition-none  Current opioids rx-Tylenol 3 4 times daily as needed and oxazepam 15 mg daily as needed # meds rx-15/month of the Tylenol and 30/month of the oxazepam Effectiveness of current meds-works well Adverse reactions from pain meds-none Morphine equivalent-18  Pill count performed-No Last drug screen -5-24 ( high risk q56m, moderate risk q93m, low risk yearly ) Urine drug screen today- No Was the NCCSR reviewed-yes  If yes  were their any concerning findings? -None Pain contract signed on: 5-24  Relevant past medical, surgical, family and social history reviewed and updated as indicated. Interim medical history since our last visit reviewed. Allergies and medications reviewed and updated.  Review of Systems  Constitutional:  Negative for chills and fever.  HENT:  Negative for congestion, ear discharge and ear pain.   Eyes:  Negative for redness and visual disturbance.  Respiratory:  Negative for chest tightness and shortness of breath.   Cardiovascular:  Negative for chest pain and leg swelling.  Genitourinary:  Negative for difficulty urinating and dysuria.  Musculoskeletal:  Negative for back pain and gait problem.  Skin:  Negative for rash.  Neurological:  Negative for dizziness, light-headedness and headaches.  Psychiatric/Behavioral:  Negative for agitation and behavioral problems.   All other systems reviewed and are negative.   Per HPI unless specifically indicated above   Allergies as of 08/15/2023       Reactions   Aspirin    States is not suppose to take due to eye hemorrhages.         Medication List        Accurate as of August 15, 2023  2:26 PM. If you have any questions, ask your nurse or doctor.          acetaminophen 500 MG tablet Commonly known as: TYLENOL Take 500 mg by mouth every 6 (six) hours as needed.   acetaminophen-codeine 300-30 MG tablet Commonly known as: TYLENOL #3 Take 1 tablet by mouth every  6 (six) hours as needed for moderate pain (pain score 4-6).   Biotin 41324 MCG Tabs Take by mouth daily.   clotrimazole-betamethasone cream Commonly known as: LOTRISONE Apply topically 2 (two) times daily.   Contour Next Test test strip Generic drug: glucose blood Test BS 3 times daily   DULoxetine 30 MG capsule Commonly known as: CYMBALTA Take 1 capsule (30 mg total) by mouth daily.   estradiol 0.1 MG/GM vaginal cream Commonly known as: ESTRACE  VAGINAL Place 1 Applicatorful vaginally at bedtime for 14 days, THEN 1 Applicatorful 3 (three) times a week. Start taking on: August 15, 2023 Started by: Ivin Booty A Rydell Wiegel   famotidine 20 MG tablet Commonly known as: PEPCID Take 1 tablet (20 mg total) by mouth 2 (two) times daily.   fluticasone 50 MCG/ACT nasal spray Commonly known as: FLONASE Place 1 spray into both nostrils 2 (two) times daily as needed for allergies or rhinitis.   hydrochlorothiazide 25 MG tablet Commonly known as: HYDRODIURIL Take 25 mg by mouth daily.   insulin aspart 100 UNIT/ML injection Commonly known as: novoLOG Use approximately 80 units daily via Insulin Pump  DX E10.9 and Z96.41   lisinopril 10 MG tablet Commonly known as: ZESTRIL Take 1 tablet (10 mg total) by mouth daily.   omeprazole 20 MG capsule Commonly known as: PRILOSEC Take 1 capsule (20 mg total) by mouth daily.   oxazepam 15 MG capsule Commonly known as: SERAX Take 1 capsule (15 mg total) by mouth daily as needed for sleep.   promethazine-dextromethorphan 6.25-15 MG/5ML syrup Commonly known as: PROMETHAZINE-DM Take 5 mLs by mouth 4 (four) times daily as needed for cough.   TRUEplus Lancets 33G Misc Test blood sugars 3 times daily   VITAMIN C PO Take by mouth.   Vitamin D (Ergocalciferol) 1.25 MG (50000 UNIT) Caps capsule Commonly known as: DRISDOL Take 1 capsule (50,000 Units total) by mouth every 7 (seven) days.   vitamin E 180 MG (400 UNITS) capsule Take by mouth.         Objective:   BP 135/69   Pulse 93   Ht 5\' 4"  (1.626 m)   Wt 242 lb (109.8 kg)   LMP 03/24/2013   SpO2 98%   BMI 41.54 kg/m   Wt Readings from Last 3 Encounters:  08/15/23 242 lb (109.8 kg)  08/05/23 240 lb (108.9 kg)  05/15/23 231 lb 3.2 oz (104.9 kg)    Physical Exam Vitals and nursing note reviewed.  Constitutional:      General: She is not in acute distress.    Appearance: She is well-developed. She is not diaphoretic.  Eyes:      Conjunctiva/sclera: Conjunctivae normal.  Cardiovascular:     Rate and Rhythm: Normal rate and regular rhythm.     Heart sounds: Normal heart sounds. No murmur heard. Pulmonary:     Effort: Pulmonary effort is normal. No respiratory distress.     Breath sounds: Normal breath sounds. No wheezing.  Musculoskeletal:        General: No tenderness. Normal range of motion.  Skin:    General: Skin is warm and dry.     Findings: No rash.  Neurological:     Mental Status: She is alert and oriented to person, place, and time.     Coordination: Coordination normal.  Psychiatric:        Behavior: Behavior normal.     Results for orders placed or performed in visit on 08/05/23  Cytology - PAP([place])  Collection Time: 08/05/23  3:14 PM  Result Value Ref Range   Adequacy      Satisfactory for evaluation; transformation zone component PRESENT.   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)   Relevant Medications   lisinopril (ZESTRIL) 10 MG tablet   Other Relevant Orders   CBC with Differential/Platelet   CMP14+EGFR   Lipid panel   Bayer DCA Hb A1c Waived     Other   GAD (generalized anxiety disorder)   Relevant Medications   acetaminophen-codeine (TYLENOL #3) 300-30 MG tablet   oxazepam (SERAX) 15 MG capsule   Other Visit Diagnoses       Bilateral shoulder region arthritis        Relevant Medications   acetaminophen-codeine (TYLENOL #3) 300-30 MG tablet     Atrophic vaginitis       Relevant Medications   estradiol (ESTRACE VAGINAL) 0.1 MG/GM vaginal cream       Continue current medicine, seems to be doing well.  Blood pressure looks decent.  Will try estrogen cream because of atrophic vaginitis.  A1c is 6.4, looks good, no changes. Follow up plan: Return in about 3 months (around 11/12/2023), or if symptoms worsen or fail to improve, for Diabetes and hypertension and pain management and anxiety.  Counseling provided for all of the vaccine components Orders Placed This Encounter  Procedures   CBC with Differential/Platelet   CMP14+EGFR   Lipid panel   Bayer DCA Hb A1c Waived    Arville Care, MD [facility] 08/15/2023, 2:26 PM

## 2023-08-16 LAB — CBC WITH DIFFERENTIAL/PLATELET
Basophils Absolute: 0.1 10*3/uL (ref 0.0–0.2)
Basos: 1 %
EOS (ABSOLUTE): 0.4 10*3/uL (ref 0.0–0.4)
Eos: 6 %
Hematocrit: 32.5 % — ABNORMAL LOW (ref 34.0–46.6)
Hemoglobin: 11 g/dL — ABNORMAL LOW (ref 11.1–15.9)
Immature Grans (Abs): 0 10*3/uL (ref 0.0–0.1)
Immature Granulocytes: 0 %
Lymphocytes Absolute: 1.5 10*3/uL (ref 0.7–3.1)
Lymphs: 20 %
MCH: 30.2 pg (ref 26.6–33.0)
MCHC: 33.8 g/dL (ref 31.5–35.7)
MCV: 89 fL (ref 79–97)
Monocytes Absolute: 0.4 10*3/uL (ref 0.1–0.9)
Monocytes: 6 %
Neutrophils Absolute: 4.9 10*3/uL (ref 1.4–7.0)
Neutrophils: 67 %
Platelets: 280 10*3/uL (ref 150–450)
RBC: 3.64 x10E6/uL — ABNORMAL LOW (ref 3.77–5.28)
RDW: 12.2 % (ref 11.7–15.4)
WBC: 7.3 10*3/uL (ref 3.4–10.8)

## 2023-08-16 LAB — CMP14+EGFR
ALT: 12 [IU]/L (ref 0–32)
AST: 18 [IU]/L (ref 0–40)
Albumin: 4.1 g/dL (ref 3.8–4.9)
Alkaline Phosphatase: 85 [IU]/L (ref 44–121)
BUN/Creatinine Ratio: 19 (ref 12–28)
BUN: 25 mg/dL (ref 8–27)
Bilirubin Total: 0.3 mg/dL (ref 0.0–1.2)
CO2: 20 mmol/L (ref 20–29)
Calcium: 8.7 mg/dL (ref 8.7–10.3)
Chloride: 106 mmol/L (ref 96–106)
Creatinine, Ser: 1.31 mg/dL — ABNORMAL HIGH (ref 0.57–1.00)
Globulin, Total: 1.9 g/dL (ref 1.5–4.5)
Glucose: 225 mg/dL — ABNORMAL HIGH (ref 70–99)
Potassium: 4.8 mmol/L (ref 3.5–5.2)
Sodium: 141 mmol/L (ref 134–144)
Total Protein: 6 g/dL (ref 6.0–8.5)
eGFR: 47 mL/min/{1.73_m2} — ABNORMAL LOW (ref >59)

## 2023-08-16 LAB — LIPID PANEL
Chol/HDL Ratio: 1.9 {ratio} (ref 0.0–4.4)
Cholesterol, Total: 139 mg/dL (ref 100–199)
HDL: 72 mg/dL (ref >39)
LDL Chol Calc (NIH): 52 mg/dL (ref 0–99)
Triglycerides: 78 mg/dL (ref 0–149)
VLDL Cholesterol Cal: 15 mg/dL (ref 5–40)

## 2023-09-16 DIAGNOSIS — N1831 Chronic kidney disease, stage 3a: Secondary | ICD-10-CM | POA: Diagnosis not present

## 2023-09-16 DIAGNOSIS — Z6841 Body Mass Index (BMI) 40.0 and over, adult: Secondary | ICD-10-CM | POA: Diagnosis not present

## 2023-09-16 DIAGNOSIS — E103553 Type 1 diabetes mellitus with stable proliferative diabetic retinopathy, bilateral: Secondary | ICD-10-CM | POA: Diagnosis not present

## 2023-09-16 DIAGNOSIS — E10649 Type 1 diabetes mellitus with hypoglycemia without coma: Secondary | ICD-10-CM | POA: Diagnosis not present

## 2023-09-16 DIAGNOSIS — E103513 Type 1 diabetes mellitus with proliferative diabetic retinopathy with macular edema, bilateral: Secondary | ICD-10-CM | POA: Diagnosis not present

## 2023-09-16 DIAGNOSIS — E559 Vitamin D deficiency, unspecified: Secondary | ICD-10-CM | POA: Diagnosis not present

## 2023-09-16 DIAGNOSIS — Z9641 Presence of insulin pump (external) (internal): Secondary | ICD-10-CM | POA: Diagnosis not present

## 2023-10-02 DIAGNOSIS — Z961 Presence of intraocular lens: Secondary | ICD-10-CM | POA: Diagnosis not present

## 2023-10-02 DIAGNOSIS — H2511 Age-related nuclear cataract, right eye: Secondary | ICD-10-CM | POA: Diagnosis not present

## 2023-10-02 DIAGNOSIS — E1136 Type 2 diabetes mellitus with diabetic cataract: Secondary | ICD-10-CM | POA: Diagnosis not present

## 2023-10-02 DIAGNOSIS — E113593 Type 2 diabetes mellitus with proliferative diabetic retinopathy without macular edema, bilateral: Secondary | ICD-10-CM | POA: Diagnosis not present

## 2023-10-02 LAB — HM DIABETES EYE EXAM

## 2023-11-13 ENCOUNTER — Encounter: Payer: Self-pay | Admitting: Family Medicine

## 2023-11-13 ENCOUNTER — Ambulatory Visit: Payer: Medicare Other | Admitting: Family Medicine

## 2023-11-13 VITALS — BP 118/67 | HR 95 | Ht 64.0 in | Wt 236.0 lb

## 2023-11-13 DIAGNOSIS — M19011 Primary osteoarthritis, right shoulder: Secondary | ICD-10-CM | POA: Diagnosis not present

## 2023-11-13 DIAGNOSIS — E1159 Type 2 diabetes mellitus with other circulatory complications: Secondary | ICD-10-CM

## 2023-11-13 DIAGNOSIS — M19012 Primary osteoarthritis, left shoulder: Secondary | ICD-10-CM

## 2023-11-13 DIAGNOSIS — N183 Chronic kidney disease, stage 3 unspecified: Secondary | ICD-10-CM

## 2023-11-13 DIAGNOSIS — E1022 Type 1 diabetes mellitus with diabetic chronic kidney disease: Secondary | ICD-10-CM

## 2023-11-13 DIAGNOSIS — F411 Generalized anxiety disorder: Secondary | ICD-10-CM | POA: Diagnosis not present

## 2023-11-13 DIAGNOSIS — I152 Hypertension secondary to endocrine disorders: Secondary | ICD-10-CM

## 2023-11-13 DIAGNOSIS — Z79891 Long term (current) use of opiate analgesic: Secondary | ICD-10-CM | POA: Diagnosis not present

## 2023-11-13 LAB — BAYER DCA HB A1C WAIVED: HB A1C (BAYER DCA - WAIVED): 6.2 % — ABNORMAL HIGH (ref 4.8–5.6)

## 2023-11-13 LAB — LIPID PANEL

## 2023-11-13 MED ORDER — OXAZEPAM 15 MG PO CAPS: 15.0000 mg | ORAL_CAPSULE | Freq: Every day | ORAL | 2 refills | Status: DC | PRN

## 2023-11-13 MED ORDER — ACETAMINOPHEN-CODEINE 300-30 MG PO TABS: 1.0000 | ORAL_TABLET | Freq: Four times a day (QID) | ORAL | 0 refills | Status: DC | PRN

## 2023-11-13 NOTE — Progress Notes (Signed)
 BP 118/67   Pulse 95   Ht 5\' 4"  (1.626 m)   Wt 236 lb (107 kg)   LMP 03/24/2013   SpO2 98%   BMI 40.51 kg/m    Subjective:   [name], female    DOB: 20-Feb-1961, 63 y.o.   MRN: [id number]  HPI: Carol Jensen is a 63 y.o. female presenting on 11/13/2023 for Medical Management of Chronic Issues, Chronic Kidney Disease, Diabetes, and Hypertension   HPI Type 1 diabetes mellitus Patient comes in today for recheck of his diabetes. Patient has been currently taking NovoLog pump, sees endocrinology as well. Patient is currently on an ACE inhibitor/ARB. Patient has not seen an ophthalmologist this year. Patient denies any new issues with their feet. The symptom started onset as an adult CKD 3 and hypertension ARE RELATED TO DM   Hypertension Patient is currently on lisinopril /hydrochlorothiazide, and their blood pressure today is 118/67. Patient denies any lightheadedness or dizziness. Patient denies headaches, blurred vision, chest pains, shortness of breath, or weakness. Denies any side effects from medication and is content with current medication.   Anxiety recheck Current rx-Tylenol  3 every 6 hours as needed and oxazepam  15 mg nightly as needed # meds rx-45 and 30/month Effectiveness of current meds-works well Adverse reactions form meds-none Pill count performed-No Last drug screen -5-24 ( high risk q27m, moderate risk q76m, low risk yearly ) Urine drug screen today- Yes Was the NCCSR reviewed-yes  If yes were their any concerning findings? -None Controlled substance contract signed on: Today  Relevant past medical, surgical, family and social history reviewed and updated as indicated. Interim medical history since our last visit reviewed. Allergies and medications reviewed and updated.  Review of Systems  Constitutional:  Negative for chills and fever.  HENT:  Negative for congestion, ear discharge and ear pain.   Eyes:  Negative for visual disturbance.   Respiratory:  Negative for chest tightness and shortness of breath.   Cardiovascular:  Negative for chest pain and leg swelling.  Genitourinary:  Negative for difficulty urinating and dysuria.  Musculoskeletal:  Positive for arthralgias. Negative for back pain and gait problem.  Skin:  Negative for rash.  Neurological:  Negative for light-headedness and headaches.  Psychiatric/Behavioral:  Negative for agitation and behavioral problems. The patient is nervous/anxious.   All other systems reviewed and are negative.   Per HPI unless specifically indicated above   Allergies as of 11/13/2023       Reactions   Aspirin    States is not suppose to take due to eye hemorrhages.         Medication List        Accurate as of Nov 13, 2023  1:36 PM. If you have any questions, ask your nurse or doctor.          acetaminophen  500 MG tablet Commonly known as: TYLENOL  Take 500 mg by mouth every 6 (six) hours as needed.   acetaminophen -codeine  300-30 MG tablet Commonly known as: TYLENOL  #3 Take 1 tablet by mouth every 6 (six) hours as needed for moderate pain (pain score 4-6).   Biotin 10000 MCG Tabs Take by mouth daily.   clotrimazole -betamethasone  cream Commonly known as: LOTRISONE  Apply topically 2 (two) times daily.   Contour Next Test test strip Generic drug: glucose blood Test BS 3 times daily   DULoxetine  30 MG capsule Commonly known as: CYMBALTA  Take 1 capsule (30 mg total) by mouth daily.   estradiol  0.1 MG/GM  vaginal cream Commonly known as: ESTRACE  VAGINAL Place 1 Applicatorful vaginally at bedtime for 14 days, THEN 1 Applicatorful 3 (three) times a week. Start taking on: August 15, 2023   famotidine  20 MG tablet Commonly known as: PEPCID  Take 1 tablet (20 mg total) by mouth 2 (two) times daily.   fluticasone  50 MCG/ACT nasal spray Commonly known as: FLONASE  Place 1 spray into both nostrils 2 (two) times daily as needed for allergies or rhinitis.    hydrochlorothiazide 25 MG tablet Commonly known as: HYDRODIURIL Take 25 mg by mouth daily.   insulin  aspart 100 UNIT/ML injection Commonly known as: novoLOG Use approximately 80 units daily via Insulin  Pump  DX E10.9 and Z96.41   lisinopril  10 MG tablet Commonly known as: ZESTRIL  Take 1 tablet (10 mg total) by mouth daily.   omeprazole  20 MG capsule Commonly known as: PRILOSEC Take 1 capsule (20 mg total) by mouth daily.   oxazepam  15 MG capsule Commonly known as: SERAX  Take 1 capsule (15 mg total) by mouth daily as needed for sleep.   promethazine -dextromethorphan 6.25-15 MG/5ML syrup Commonly known as: PROMETHAZINE -DM Take 5 mLs by mouth 4 (four) times daily as needed for cough.   TRUEplus Lancets 33G Misc Test blood sugars 3 times daily   VITAMIN C PO Take by mouth.   Vitamin D  (Ergocalciferol ) 1.25 MG (50000 UNIT) Caps capsule Commonly known as: DRISDOL  Take 1 capsule (50,000 Units total) by mouth every 7 (seven) days.   vitamin E 180 MG (400 UNITS) capsule Take by mouth.         Objective:   BP 118/67   Pulse 95   Ht 5\' 4"  (1.626 m)   Wt 236 lb (107 kg)   LMP 03/24/2013   SpO2 98%   BMI 40.51 kg/m   Wt Readings from Last 3 Encounters:  11/13/23 236 lb (107 kg)  08/15/23 242 lb (109.8 kg)  08/05/23 240 lb (108.9 kg)    Physical Exam Vitals and nursing note reviewed.  Constitutional:      General: She is not in acute distress.    Appearance: She is well-developed. She is not diaphoretic.  Eyes:     Conjunctiva/sclera: Conjunctivae normal.  Cardiovascular:     Rate and Rhythm: Normal rate and regular rhythm.     Heart sounds: Normal heart sounds. No murmur heard. Pulmonary:     Effort: Pulmonary effort is normal. No respiratory distress.     Breath sounds: Normal breath sounds. No wheezing.  Musculoskeletal:        General: No swelling.  Skin:    General: Skin is warm and dry.     Findings: No rash.  Neurological:     Mental Status: She  is alert and oriented to person, place, and time.     Coordination: Coordination normal.  Psychiatric:        Behavior: Behavior normal.       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  Blood pressure and everything else looks good.  She has occasional low but keeps close has Dexcom and her insulin  pump.  Follow up plan: Return in about 3 months (around 02/13/2024), or if symptoms worsen or fail to improve, for Diabetes and anxiety recheck.  Counseling provided for all of the vaccine components Orders Placed This Encounter  Procedures   Bayer DCA Hb A1c Waived   CBC with Differential/Platelet   CMP14+EGFR   Lipid panel   ToxASSURE Select 13 (MW), Urine   HM DIABETES EYE EXAM    Jolyne Needs, MD [facility] 11/13/2023, 1:36 PM

## 2023-11-14 LAB — CMP14+EGFR
ALT: 11 IU/L (ref 0–32)
AST: 19 IU/L (ref 0–40)
Albumin: 4.2 g/dL (ref 3.8–4.9)
Alkaline Phosphatase: 92 IU/L (ref 44–121)
BUN/Creatinine Ratio: 20 (ref 12–28)
BUN: 30 mg/dL — ABNORMAL HIGH (ref 8–27)
CO2: 20 mmol/L (ref 20–29)
Calcium: 9.1 mg/dL (ref 8.7–10.3)
Chloride: 106 mmol/L (ref 96–106)
Creatinine, Ser: 1.51 mg/dL — ABNORMAL HIGH (ref 0.57–1.00)
Globulin, Total: 2.1 g/dL (ref 1.5–4.5)
Glucose: 85 mg/dL (ref 70–99)
Potassium: 4.7 mmol/L (ref 3.5–5.2)
Sodium: 141 mmol/L (ref 134–144)
Total Protein: 6.3 g/dL (ref 6.0–8.5)
eGFR: 39 mL/min/{1.73_m2} — ABNORMAL LOW (ref >59)

## 2023-11-14 LAB — CBC WITH DIFFERENTIAL/PLATELET
Basophils Absolute: 0.1 10*3/uL (ref 0.0–0.2)
Basos: 1 %
EOS (ABSOLUTE): 0.4 10*3/uL (ref 0.0–0.4)
Eos: 5 %
Hematocrit: 33.5 % — ABNORMAL LOW (ref 34.0–46.6)
Hemoglobin: 10.7 g/dL — ABNORMAL LOW (ref 11.1–15.9)
Immature Grans (Abs): 0 10*3/uL (ref 0.0–0.1)
Immature Granulocytes: 0 %
Lymphocytes Absolute: 1.7 10*3/uL (ref 0.7–3.1)
Lymphs: 23 %
MCH: 29.5 pg (ref 26.6–33.0)
MCHC: 31.9 g/dL (ref 31.5–35.7)
MCV: 92 fL (ref 79–97)
Monocytes Absolute: 0.5 10*3/uL (ref 0.1–0.9)
Monocytes: 6 %
Neutrophils Absolute: 4.9 10*3/uL (ref 1.4–7.0)
Neutrophils: 65 %
Platelets: 283 10*3/uL (ref 150–450)
RBC: 3.63 x10E6/uL — ABNORMAL LOW (ref 3.77–5.28)
RDW: 13.1 % (ref 11.7–15.4)
WBC: 7.5 10*3/uL (ref 3.4–10.8)

## 2023-11-14 LAB — LIPID PANEL
Cholesterol, Total: 122 mg/dL (ref 100–199)
HDL: 67 mg/dL (ref >39)
LDL CALC COMMENT:: 1.8 ratio (ref 0.0–4.4)
LDL Chol Calc (NIH): 40 mg/dL (ref 0–99)
Triglycerides: 73 mg/dL (ref 0–149)
VLDL Cholesterol Cal: 15 mg/dL (ref 5–40)

## 2023-11-17 LAB — TOXASSURE SELECT 13 (MW), URINE

## 2023-11-25 ENCOUNTER — Ambulatory Visit: Payer: Self-pay | Admitting: Family Medicine

## 2023-12-17 DIAGNOSIS — Z6841 Body Mass Index (BMI) 40.0 and over, adult: Secondary | ICD-10-CM | POA: Diagnosis not present

## 2023-12-17 DIAGNOSIS — E10649 Type 1 diabetes mellitus with hypoglycemia without coma: Secondary | ICD-10-CM | POA: Diagnosis not present

## 2023-12-17 DIAGNOSIS — Z9641 Presence of insulin pump (external) (internal): Secondary | ICD-10-CM | POA: Diagnosis not present

## 2023-12-17 DIAGNOSIS — E103513 Type 1 diabetes mellitus with proliferative diabetic retinopathy with macular edema, bilateral: Secondary | ICD-10-CM | POA: Diagnosis not present

## 2023-12-17 DIAGNOSIS — N1831 Chronic kidney disease, stage 3a: Secondary | ICD-10-CM | POA: Diagnosis not present

## 2024-01-10 ENCOUNTER — Other Ambulatory Visit: Payer: Self-pay | Admitting: Family Medicine

## 2024-02-13 ENCOUNTER — Encounter: Payer: Self-pay | Admitting: Family Medicine

## 2024-02-13 ENCOUNTER — Ambulatory Visit (INDEPENDENT_AMBULATORY_CARE_PROVIDER_SITE_OTHER): Admitting: Family Medicine

## 2024-02-13 VITALS — BP 117/76 | HR 90 | Temp 97.5°F | Ht 64.0 in | Wt 243.4 lb

## 2024-02-13 DIAGNOSIS — E1040 Type 1 diabetes mellitus with diabetic neuropathy, unspecified: Secondary | ICD-10-CM | POA: Diagnosis not present

## 2024-02-13 DIAGNOSIS — E1059 Type 1 diabetes mellitus with other circulatory complications: Secondary | ICD-10-CM

## 2024-02-13 DIAGNOSIS — N183 Chronic kidney disease, stage 3 unspecified: Secondary | ICD-10-CM

## 2024-02-13 DIAGNOSIS — F411 Generalized anxiety disorder: Secondary | ICD-10-CM | POA: Diagnosis not present

## 2024-02-13 DIAGNOSIS — F339 Major depressive disorder, recurrent, unspecified: Secondary | ICD-10-CM | POA: Diagnosis not present

## 2024-02-13 DIAGNOSIS — R0981 Nasal congestion: Secondary | ICD-10-CM

## 2024-02-13 DIAGNOSIS — I152 Hypertension secondary to endocrine disorders: Secondary | ICD-10-CM | POA: Diagnosis not present

## 2024-02-13 DIAGNOSIS — M19012 Primary osteoarthritis, left shoulder: Secondary | ICD-10-CM | POA: Diagnosis not present

## 2024-02-13 DIAGNOSIS — E103599 Type 1 diabetes mellitus with proliferative diabetic retinopathy without macular edema, unspecified eye: Secondary | ICD-10-CM

## 2024-02-13 DIAGNOSIS — M19011 Primary osteoarthritis, right shoulder: Secondary | ICD-10-CM | POA: Diagnosis not present

## 2024-02-13 DIAGNOSIS — E1022 Type 1 diabetes mellitus with diabetic chronic kidney disease: Secondary | ICD-10-CM

## 2024-02-13 MED ORDER — FLUTICASONE PROPIONATE 50 MCG/ACT NA SUSP: 1.0000 | Freq: Two times a day (BID) | NASAL | 6 refills | Status: AC | PRN

## 2024-02-13 MED ORDER — OMEPRAZOLE 20 MG PO CPDR: 20.0000 mg | DELAYED_RELEASE_CAPSULE | Freq: Every day | ORAL | 0 refills | Status: DC

## 2024-02-13 MED ORDER — ACETAMINOPHEN 500 MG PO TABS: 500.0000 mg | ORAL_TABLET | Freq: Four times a day (QID) | ORAL | 1 refills | Status: AC | PRN

## 2024-02-13 MED ORDER — ACETAMINOPHEN-CODEINE 300-30 MG PO TABS: 1.0000 | ORAL_TABLET | Freq: Four times a day (QID) | ORAL | 0 refills | Status: DC | PRN

## 2024-02-13 MED ORDER — DULOXETINE HCL 60 MG PO CPEP: 60.0000 mg | ORAL_CAPSULE | Freq: Every day | ORAL | 1 refills | Status: DC

## 2024-02-13 MED ORDER — OXAZEPAM 15 MG PO CAPS: 15.0000 mg | ORAL_CAPSULE | Freq: Every day | ORAL | 2 refills | Status: DC | PRN

## 2024-02-13 NOTE — Progress Notes (Signed)
 BP 117/76   Pulse 90   Temp (!) 97.5 F (36.4 C) (Temporal)   Ht 5' 4 (1.626 m)   Wt 243 lb 6.4 oz (110.4 kg)   LMP 03/24/2013   SpO2 98%   BMI 41.78 kg/m    Subjective:   [name], female    DOB: 07-Aug-1960, 63 y.o.   MRN: [id number]  HPI: Carol Jensen is a 63 y.o. female presenting on 02/13/2024 for Medical Management of Chronic Issues (3 month), Nasal Congestion (Symptoms for a week), and Headache   Discussed the use of AI scribe software for clinical note transcription with the patient, who gave verbal consent to proceed.  History of Present Illness   Kurt R Kosak is a 63 year old female with type 1 diabetes, hypertension, CKD stage 3, and anxiety who presents for a recheck of her chronic medical issues.  She has type 1 diabetes with complications including diabetic retinopathy and diabetic neuropathy.  She sees endocrinology next month we will do the A1c with them  Her hypertension is managed with lisinopril  and hydrochlorothiazide.  She has CKD stage 3, associated with her diabetes. There is no new information provided about her CKD management in this visit.  She experiences anxiety and is currently taking duloxetine  and oxazepam  15 mg daily as needed. She reports increased depression due to personal stressors, including her mother's health and her brother's passing.  She reports congestion and a cough, particularly at night, which she attributes to possible allergies. She is currently out of Flonase  and does not take any allergy pills like Claritin or Zyrtec. She has been around individuals with respiratory illnesses, including a friend with pneumonia. No fever.      Relevant past medical, surgical, family and social history reviewed and updated as indicated. Interim medical history since our last visit reviewed. Allergies and medications reviewed and updated.  Review of Systems  Constitutional:  Negative for chills and fever.  HENT:  Positive for  congestion and sneezing. Negative for sinus pressure, sinus pain and sore throat.   Eyes:  Negative for visual disturbance.  Respiratory:  Positive for cough. Negative for chest tightness, shortness of breath and wheezing.   Cardiovascular:  Negative for chest pain and leg swelling.  Genitourinary:  Negative for difficulty urinating and dysuria.  Musculoskeletal:  Negative for back pain and gait problem.  Skin:  Negative for rash.  Neurological:  Negative for light-headedness and headaches.  Psychiatric/Behavioral:  Negative for agitation and behavioral problems.   All other systems reviewed and are negative.   Per HPI unless specifically indicated above   Allergies as of 02/13/2024       Reactions   Aspirin    States is not suppose to take due to eye hemorrhages.         Medication List        Accurate as of February 13, 2024  1:15 PM. If you have any questions, ask your nurse or doctor.          acetaminophen  500 MG tablet Commonly known as: TYLENOL  Take 1 tablet (500 mg total) by mouth every 6 (six) hours as needed.   acetaminophen -codeine  300-30 MG tablet Commonly known as: TYLENOL  #3 Take 1 tablet by mouth every 6 (six) hours as needed for moderate pain (pain score 4-6).   Biotin 10000 MCG Tabs Take by mouth daily.   clotrimazole -betamethasone  cream Commonly known as: LOTRISONE  Apply topically 2 (two) times daily.   Contour  Next Test test strip Generic drug: glucose blood Test BS 3 times daily   DULoxetine  60 MG capsule Commonly known as: CYMBALTA  Take 1 capsule (60 mg total) by mouth daily. What changed:  medication strength how much to take Changed by: Fonda LABOR Anelia Carriveau   famotidine  20 MG tablet Commonly known as: PEPCID  Take 1 tablet (20 mg total) by mouth 2 (two) times daily.   fluticasone  50 MCG/ACT nasal spray Commonly known as: FLONASE  Place 1 spray into both nostrils 2 (two) times daily as needed for allergies or rhinitis.    hydrochlorothiazide 25 MG tablet Commonly known as: HYDRODIURIL Take 25 mg by mouth daily.   insulin  aspart 100 UNIT/ML injection Commonly known as: novoLOG Use approximately 80 units daily via Insulin  Pump  DX E10.9 and Z96.41   lisinopril  10 MG tablet Commonly known as: ZESTRIL  Take 1 tablet (10 mg total) by mouth daily.   omeprazole  20 MG capsule Commonly known as: PRILOSEC Take 1 capsule (20 mg total) by mouth daily.   oxazepam  15 MG capsule Commonly known as: SERAX  Take 1 capsule (15 mg total) by mouth daily as needed for sleep.   promethazine -dextromethorphan 6.25-15 MG/5ML syrup Commonly known as: PROMETHAZINE -DM Take 5 mLs by mouth 4 (four) times daily as needed for cough.   TRUEplus Lancets 33G Misc Test blood sugars 3 times daily   VITAMIN C PO Take by mouth.   Vitamin D  (Ergocalciferol ) 1.25 MG (50000 UNIT) Caps capsule Commonly known as: DRISDOL  Take 1 capsule (50,000 Units total) by mouth every 7 (seven) days.   vitamin E 180 MG (400 UNITS) capsule Take by mouth.         Objective:   BP 117/76   Pulse 90   Temp (!) 97.5 F (36.4 C) (Temporal)   Ht 5' 4 (1.626 m)   Wt 243 lb 6.4 oz (110.4 kg)   LMP 03/24/2013   SpO2 98%   BMI 41.78 kg/m   Wt Readings from Last 3 Encounters:  02/13/24 243 lb 6.4 oz (110.4 kg)  11/13/23 236 lb (107 kg)  08/15/23 242 lb (109.8 kg)    Physical Exam Physical Exam   VITALS: BP- 117/76 HEENT: Throat without erythema or exudate. NECK: Neck supple, no masses. Thyroid  without nodules or enlargement. CHEST: Lungs clear to auscultation bilaterally. CARDIOVASCULAR: Regular heart sounds with systolic murmur near aortic region.         Assessment & Plan:   Problem List Items Addressed This Visit       Cardiovascular and Mediastinum   Hypertension associated with diabetes (HCC)     Endocrine   DM type 1 (diabetes mellitus, type 1) (HCC) - Primary   Relevant Orders   Bayer DCA Hb A1c Waived    Proliferative diabetic retinopathy associated with type 1 diabetes mellitus (HCC)   CKD stage 3 due to type 1 diabetes mellitus (HCC)   Type 1 diabetes mellitus with diabetic neuropathy, unspecified (HCC)     Other   GAD (generalized anxiety disorder)   Relevant Medications   oxazepam  (SERAX ) 15 MG capsule   acetaminophen -codeine  (TYLENOL  #3) 300-30 MG tablet   DULoxetine  (CYMBALTA ) 60 MG capsule   Depression, recurrent (HCC)   Relevant Medications   oxazepam  (SERAX ) 15 MG capsule   DULoxetine  (CYMBALTA ) 60 MG capsule   Other Visit Diagnoses       Bilateral shoulder region arthritis       Relevant Medications   acetaminophen  (TYLENOL ) 500 MG tablet   acetaminophen -codeine  (TYLENOL  #3)  300-30 MG tablet     Sinus congestion       Relevant Medications   fluticasone  (FLONASE ) 50 MCG/ACT nasal spray           Type 1 diabetes mellitus with diabetic chronic kidney disease, proliferative diabetic retinopathy, and diabetic neuropathy Chronic management of type 1 diabetes with complications including chronic kidney disease stage 3, proliferative diabetic retinopathy, and diabetic neuropathy. No acute issues. - Labs to be drawn at next [facility] appointment with Dr. Cindie.  Hypertension Hypertension well-controlled with current medications. Blood pressure 117/76 mmHg.  Generalized anxiety disorder and depression Exacerbation of depressive symptoms likely due to psychosocial stressors. On duloxetine  and oxazepam . - Increase duloxetine  to 60 mg daily. - Continue oxazepam  15 mg as needed for anxiety. - Advise caution with concurrent use of oxazepam  and Tylenol  3.  Chronic osteoarthritis of knees Chronic osteoarthritis of the knees.  Allergic rhinitis Congestion and drainage likely due to allergic rhinitis. No signs of infection. - Prescribe Flonase  nasal spray. - Recommend over-the-counter Claritin or Zyrtec during the day. - Recommend Benadryl at night. - Advise use of Mucinex as  needed. - Instruct to monitor for signs of infection such as fever and to call if symptoms worsen.          Follow up plan: Return in about 3 months (around 05/15/2024), or if symptoms worsen or fail to improve, for Anxiety depression recheck and diabetes.  Counseling provided for all of the vaccine components Orders Placed This Encounter  Procedures   Bayer DCA Hb A1c Waived    Fonda Levins, MD [facility] 02/13/2024, 1:15 PM

## 2024-03-24 DIAGNOSIS — Z23 Encounter for immunization: Secondary | ICD-10-CM | POA: Diagnosis not present

## 2024-05-18 ENCOUNTER — Ambulatory Visit: Admitting: Family Medicine

## 2024-05-20 DIAGNOSIS — E103513 Type 1 diabetes mellitus with proliferative diabetic retinopathy with macular edema, bilateral: Secondary | ICD-10-CM | POA: Diagnosis not present

## 2024-05-20 DIAGNOSIS — Z9641 Presence of insulin pump (external) (internal): Secondary | ICD-10-CM | POA: Diagnosis not present

## 2024-05-20 DIAGNOSIS — E10649 Type 1 diabetes mellitus with hypoglycemia without coma: Secondary | ICD-10-CM | POA: Diagnosis not present

## 2024-05-20 DIAGNOSIS — Z6841 Body Mass Index (BMI) 40.0 and over, adult: Secondary | ICD-10-CM | POA: Diagnosis not present

## 2024-05-21 ENCOUNTER — Telehealth: Payer: Self-pay

## 2024-05-21 ENCOUNTER — Other Ambulatory Visit (HOSPITAL_COMMUNITY): Payer: Self-pay

## 2024-05-21 ENCOUNTER — Ambulatory Visit: Payer: Self-pay | Admitting: Family Medicine

## 2024-05-21 ENCOUNTER — Encounter: Payer: Self-pay | Admitting: Family Medicine

## 2024-05-21 VITALS — BP 139/70 | HR 94 | Ht 64.0 in | Wt 249.0 lb

## 2024-05-21 DIAGNOSIS — I152 Hypertension secondary to endocrine disorders: Secondary | ICD-10-CM

## 2024-05-21 DIAGNOSIS — M19011 Primary osteoarthritis, right shoulder: Secondary | ICD-10-CM

## 2024-05-21 DIAGNOSIS — F411 Generalized anxiety disorder: Secondary | ICD-10-CM

## 2024-05-21 DIAGNOSIS — E1022 Type 1 diabetes mellitus with diabetic chronic kidney disease: Secondary | ICD-10-CM

## 2024-05-21 DIAGNOSIS — E103599 Type 1 diabetes mellitus with proliferative diabetic retinopathy without macular edema, unspecified eye: Secondary | ICD-10-CM

## 2024-05-21 DIAGNOSIS — E1040 Type 1 diabetes mellitus with diabetic neuropathy, unspecified: Secondary | ICD-10-CM

## 2024-05-21 MED ORDER — DULOXETINE HCL 60 MG PO CPEP: 60.0000 mg | ORAL_CAPSULE | Freq: Every day | ORAL | 1 refills | Status: AC

## 2024-05-21 MED ORDER — OXAZEPAM 15 MG PO CAPS: 15.0000 mg | ORAL_CAPSULE | Freq: Every day | ORAL | 2 refills | Status: AC | PRN

## 2024-05-21 MED ORDER — METHYLPREDNISOLONE ACETATE 80 MG/ML IJ SUSP
80.0000 mg | Freq: Once | INTRAMUSCULAR | Status: AC
  Administered 2024-05-21: 80 mg via INTRAMUSCULAR

## 2024-05-21 MED ORDER — ACETAMINOPHEN-CODEINE 300-30 MG PO TABS: 1.0000 | ORAL_TABLET | Freq: Four times a day (QID) | ORAL | 0 refills | Status: AC | PRN

## 2024-05-21 MED ORDER — OMEPRAZOLE 20 MG PO CPDR: 20.0000 mg | DELAYED_RELEASE_CAPSULE | Freq: Every day | ORAL | 0 refills | Status: AC

## 2024-05-21 NOTE — Progress Notes (Signed)
 BP 139/70   Pulse 94   Ht 5' 4 (1.626 m)   Wt 249 lb (112.9 kg)   LMP 03/24/2013   SpO2 100%   BMI 42.74 kg/m    Subjective:   [name], female    DOB: [birth year], 63 y.o.   MRN: [id number]  HPI: Carol Jensen is a 63 y.o. female presenting on 05/21/2024 for Medical Management of Chronic Issues, Anxiety, Depression, Diabetes, Hypertension, and Arthritis (Requesting steroid injection)   Discussed the use of AI scribe software for clinical note transcription with the patient, who gave verbal consent to proceed.  History of Present Illness   Carol Jensen is a 63 year old female who presents for a recheck of her anxiety and medication management.  Anxiety and mood symptoms - Currently taking oxazepam  and Cymbalta  for anxiety management. - Cymbalta  dose increased to 60 mg, resulting in improved anxiety control. - Experiencing significant situational stress related to her mother's health issues, including multiple falls and recent hip fracture requiring surgery. - Frustration and stress due to perceived understaffing and care concerns at her mother's rehab center, impacting overall well-being.  Diabetes mellitus management - Blood sugar levels stable. - Recent HbA1c is 6.8%. - Diabetes managed with insulin  pump only; no additional medications. - Recent laboratory evaluation of blood sugar, kidney function, and cholesterol performed yesterday by another provider.  Gastrointestinal symptoms - Continues methotrexate and heartburn medication. - Occasionally uses Zantac , especially when drinking water on an empty stomach.  Hypertension management - Blood pressure managed with hydrochlorothiazide.       Relevant past medical, surgical, family and social history reviewed and updated as indicated. Interim medical history since our last visit reviewed. Allergies and medications reviewed and updated.  Review of Systems  Constitutional:  Negative for chills and fever.   Eyes:  Negative for visual disturbance.  Respiratory:  Negative for chest tightness and shortness of breath.   Cardiovascular:  Negative for chest pain and leg swelling.  Genitourinary:  Negative for difficulty urinating and dysuria.  Musculoskeletal:  Positive for arthralgias, back pain and myalgias. Negative for gait problem.  Skin:  Negative for rash.  Neurological:  Negative for light-headedness and headaches.  Psychiatric/Behavioral:  Negative for agitation and behavioral problems.   All other systems reviewed and are negative.   Per HPI unless specifically indicated above   Allergies as of 05/21/2024       Reactions   Aspirin    States is not suppose to take due to eye hemorrhages.         Medication List        Accurate as of May 21, 2024  1:47 PM. If you have any questions, ask your nurse or doctor.          acetaminophen  500 MG tablet Commonly known as: TYLENOL  Take 1 tablet (500 mg total) by mouth every 6 (six) hours as needed.   acetaminophen -codeine  300-30 MG tablet Commonly known as: TYLENOL  #3 Take 1 tablet by mouth every 6 (six) hours as needed for moderate pain (pain score 4-6).   Biotin 10000 MCG Tabs Take by mouth daily.   clotrimazole -betamethasone  cream Commonly known as: LOTRISONE  Apply topically 2 (two) times daily.   Contour Next Test test strip Generic drug: glucose blood Test BS 3 times daily   DULoxetine  60 MG capsule Commonly known as: CYMBALTA  Take 1 capsule (60 mg total) by mouth daily.   famotidine  20 MG tablet Commonly known as: PEPCID   Take 1 tablet (20 mg total) by mouth 2 (two) times daily.   fluticasone  50 MCG/ACT nasal spray Commonly known as: FLONASE  Place 1 spray into both nostrils 2 (two) times daily as needed for allergies or rhinitis.   hydrochlorothiazide 25 MG tablet Commonly known as: HYDRODIURIL Take 25 mg by mouth daily.   insulin  aspart 100 UNIT/ML injection Commonly known as: novoLOG Use  approximately 80 units daily via Insulin  Pump  DX E10.9 and Z96.41   lisinopril  10 MG tablet Commonly known as: ZESTRIL  Take 1 tablet (10 mg total) by mouth daily.   omeprazole  20 MG capsule Commonly known as: PRILOSEC Take 1 capsule (20 mg total) by mouth daily.   oxazepam  15 MG capsule Commonly known as: SERAX  Take 1 capsule (15 mg total) by mouth daily as needed for sleep.   promethazine -dextromethorphan 6.25-15 MG/5ML syrup Commonly known as: PROMETHAZINE -DM Take 5 mLs by mouth 4 (four) times daily as needed for cough.   TRUEplus Lancets 33G Misc Test blood sugars 3 times daily   VITAMIN C PO Take by mouth.   Vitamin D  (Ergocalciferol ) 1.25 MG (50000 UNIT) Caps capsule Commonly known as: DRISDOL  Take 1 capsule (50,000 Units total) by mouth every 7 (seven) days.   vitamin E 180 MG (400 UNITS) capsule Take by mouth.         Objective:   BP 139/70   Pulse 94   Ht 5' 4 (1.626 m)   Wt 249 lb (112.9 kg)   LMP 03/24/2013   SpO2 100%   BMI 42.74 kg/m   Wt Readings from Last 3 Encounters:  05/21/24 249 lb (112.9 kg)  02/13/24 243 lb 6.4 oz (110.4 kg)  11/13/23 236 lb (107 kg)    Physical Exam Vitals and nursing note reviewed.  Constitutional:      Appearance: Normal appearance. She is obese.  Neurological:     Mental Status: She is alert.    Physical Exam   VITALS: BP- 139/70 CHEST: Lungs clear to auscultation bilaterally. CARDIOVASCULAR: Regular rate and rhythm.         Assessment & Plan:   Problem List Items Addressed This Visit       Cardiovascular and Mediastinum   Hypertension associated with diabetes (HCC)     Endocrine   DM type 1 (diabetes mellitus, type 1) (HCC) - Primary   Proliferative diabetic retinopathy associated with type 1 diabetes mellitus (HCC)   CKD stage 3 due to type 1 diabetes mellitus (HCC)   Type 1 diabetes mellitus with diabetic neuropathy, unspecified (HCC)     Other   GAD (generalized anxiety disorder)    Relevant Medications   acetaminophen -codeine  (TYLENOL  #3) 300-30 MG tablet   DULoxetine  (CYMBALTA ) 60 MG capsule   oxazepam  (SERAX ) 15 MG capsule   Morbid obesity (HCC)   Other Visit Diagnoses       Bilateral shoulder region arthritis       Relevant Medications   acetaminophen -codeine  (TYLENOL  #3) 300-30 MG tablet   methylPREDNISolone  acetate (DEPO-MEDROL ) injection 80 mg (Start on 05/21/2024  2:00 PM)          Type 1 diabetes mellitus with diabetic chronic kidney disease, neuropathy, and proliferative retinopathy Blood glucose levels well-controlled with HbA1c of 6.8%. Insulin  pump effective. Labs, including kidney function and cholesterol, satisfactory. - Continue insulin  pump therapy for glucose management.  Generalized anxiety disorder Anxiety managed with oxazepam  and Cymbalta . Improvement noted with Cymbalta  60 mg. Stress from mother's health issues may contribute to anxiety and body  aches. - Continue oxazepam  and Cymbalta  for anxiety management. - Consider steroid injection for body aches if labs satisfactory.   CKD 3, patient is stable and blood work from endocrinologist yesterday looks good.      PDMP was reviewed today and patient's contract and urine drug screen was performed on 11/18/2023.  Follow up plan: Return in about 3 months (around 08/19/2024), or if symptoms worsen or fail to improve, for Diabetes and anxiety recheck.  Counseling provided for all of the vaccine components No orders of the defined types were placed in this encounter.   Fonda Levins, MD [facility] 05/21/2024, 1:47 PM

## 2024-05-21 NOTE — Telephone Encounter (Signed)
 Pharmacy Patient Advocate Encounter   Received notification from Onbase that prior authorization for Oxazepam  15MG  capsules is required/requested.   Insurance verification completed.   The patient is insured through [employer].   Per test claim: PA required; PA submitted to above mentioned insurance via Latent Key/confirmation #/EOC [id number] Status is pending

## 2024-05-22 ENCOUNTER — Other Ambulatory Visit (HOSPITAL_COMMUNITY): Payer: Self-pay

## 2024-05-26 ENCOUNTER — Telehealth: Payer: Self-pay | Admitting: Family Medicine

## 2024-05-26 NOTE — Telephone Encounter (Signed)
 Pt needs letter to get out of [employer] for all her health issues.

## 2024-05-27 ENCOUNTER — Encounter: Payer: Self-pay | Admitting: Family Medicine

## 2024-05-27 NOTE — Telephone Encounter (Signed)
 That is fine to go ahead and write her a note for [employer]

## 2024-05-27 NOTE — Telephone Encounter (Signed)
 Pharmacy Patient Advocate Encounter  Received notification from [employer] that Prior Authorization for Oxazepam  15MG  capsules   has been APPROVED from 05/27/2024 to 06/17/2025   PA #/Case ID/Reference #: [id number]

## 2024-05-27 NOTE — Telephone Encounter (Signed)
 Spoke with patient making her aware that note has been approved and can be picked up at her convenience. She voiced understanding.

## 2024-05-27 NOTE — Telephone Encounter (Signed)
 [facility] in [place] made aware.

## 2024-08-26 ENCOUNTER — Ambulatory Visit: Admitting: Family Medicine
